# Patient Record
Sex: Female | Born: 1955
Health system: Southern US, Community
[De-identification: ages and names within clinical notes are randomized; demographics above are authoritative.]

## PROBLEM LIST (undated history)

## (undated) DIAGNOSIS — N3941 Urge incontinence: Secondary | ICD-10-CM

## (undated) DIAGNOSIS — R200 Anesthesia of skin: Secondary | ICD-10-CM

## (undated) DIAGNOSIS — K4090 Unilateral inguinal hernia, without obstruction or gangrene, not specified as recurrent: Secondary | ICD-10-CM

## (undated) DIAGNOSIS — R011 Cardiac murmur, unspecified: Secondary | ICD-10-CM

## (undated) DIAGNOSIS — Z87442 Personal history of urinary calculi: Secondary | ICD-10-CM

## (undated) DIAGNOSIS — F32A Depression, unspecified: Secondary | ICD-10-CM

## (undated) DIAGNOSIS — E785 Hyperlipidemia, unspecified: Secondary | ICD-10-CM

## (undated) DIAGNOSIS — Z8489 Family history of other specified conditions: Secondary | ICD-10-CM

## (undated) DIAGNOSIS — R202 Paresthesia of skin: Secondary | ICD-10-CM

## (undated) DIAGNOSIS — N83209 Unspecified ovarian cyst, unspecified side: Secondary | ICD-10-CM

## (undated) DIAGNOSIS — L719 Rosacea, unspecified: Secondary | ICD-10-CM

## (undated) DIAGNOSIS — K219 Gastro-esophageal reflux disease without esophagitis: Secondary | ICD-10-CM

## (undated) DIAGNOSIS — R350 Frequency of micturition: Secondary | ICD-10-CM

## (undated) DIAGNOSIS — R319 Hematuria, unspecified: Secondary | ICD-10-CM

## (undated) DIAGNOSIS — R519 Headache, unspecified: Secondary | ICD-10-CM

## (undated) DIAGNOSIS — N200 Calculus of kidney: Secondary | ICD-10-CM

## (undated) DIAGNOSIS — R3989 Other symptoms and signs involving the genitourinary system: Secondary | ICD-10-CM

## (undated) DIAGNOSIS — R51 Headache: Secondary | ICD-10-CM

## (undated) DIAGNOSIS — F329 Major depressive disorder, single episode, unspecified: Secondary | ICD-10-CM

## (undated) DIAGNOSIS — M1990 Unspecified osteoarthritis, unspecified site: Secondary | ICD-10-CM

## (undated) DIAGNOSIS — K5730 Diverticulosis of large intestine without perforation or abscess without bleeding: Secondary | ICD-10-CM

## (undated) DIAGNOSIS — M654 Radial styloid tenosynovitis [de Quervain]: Secondary | ICD-10-CM

## (undated) HISTORY — PX: CARDIAC CATHETERIZATION: SHX172

## (undated) HISTORY — PX: TYMPANOPLASTY: SHX33

## (undated) HISTORY — PX: TONSILLECTOMY: SUR1361

## (undated) HISTORY — DX: Gastro-esophageal reflux disease without esophagitis: K21.9

## (undated) HISTORY — PX: EXCISIONAL HEMORRHOIDECTOMY: SHX1541

## (undated) HISTORY — PX: COLONOSCOPY: SHX174

## (undated) HISTORY — PX: TUBAL LIGATION: SHX77

---

## 1998-10-23 ENCOUNTER — Other Ambulatory Visit: Admission: RE | Admit: 1998-10-23 | Discharge: 1998-10-23 | Payer: Self-pay | Admitting: *Deleted

## 2000-02-17 ENCOUNTER — Other Ambulatory Visit: Admission: RE | Admit: 2000-02-17 | Discharge: 2000-02-17 | Payer: Self-pay | Admitting: *Deleted

## 2001-03-21 ENCOUNTER — Emergency Department (HOSPITAL_COMMUNITY): Admission: EM | Admit: 2001-03-21 | Discharge: 2001-03-21 | Payer: Self-pay | Admitting: Emergency Medicine

## 2001-03-21 ENCOUNTER — Encounter: Payer: Self-pay | Admitting: Emergency Medicine

## 2001-03-23 ENCOUNTER — Ambulatory Visit (HOSPITAL_COMMUNITY): Admission: RE | Admit: 2001-03-23 | Discharge: 2001-03-23 | Payer: Self-pay | Admitting: Urology

## 2001-03-23 HISTORY — PX: OTHER SURGICAL HISTORY: SHX169

## 2001-04-21 ENCOUNTER — Other Ambulatory Visit: Admission: RE | Admit: 2001-04-21 | Discharge: 2001-04-21 | Payer: Self-pay | Admitting: *Deleted

## 2001-08-19 ENCOUNTER — Encounter (INDEPENDENT_AMBULATORY_CARE_PROVIDER_SITE_OTHER): Payer: Self-pay | Admitting: Specialist

## 2001-08-19 ENCOUNTER — Other Ambulatory Visit: Admission: RE | Admit: 2001-08-19 | Discharge: 2001-08-19 | Payer: Self-pay | Admitting: *Deleted

## 2002-05-10 ENCOUNTER — Other Ambulatory Visit: Admission: RE | Admit: 2002-05-10 | Discharge: 2002-05-10 | Payer: Self-pay | Admitting: *Deleted

## 2003-05-22 ENCOUNTER — Other Ambulatory Visit: Admission: RE | Admit: 2003-05-22 | Discharge: 2003-05-22 | Payer: Self-pay | Admitting: *Deleted

## 2004-07-17 ENCOUNTER — Other Ambulatory Visit: Admission: RE | Admit: 2004-07-17 | Discharge: 2004-07-17 | Payer: Self-pay | Admitting: *Deleted

## 2005-07-02 HISTORY — PX: NM MYOCAR PERF WALL MOTION: HXRAD629

## 2005-08-26 ENCOUNTER — Other Ambulatory Visit: Admission: RE | Admit: 2005-08-26 | Discharge: 2005-08-26 | Payer: Self-pay | Admitting: *Deleted

## 2006-10-20 ENCOUNTER — Other Ambulatory Visit: Admission: RE | Admit: 2006-10-20 | Discharge: 2006-10-20 | Payer: Self-pay | Admitting: *Deleted

## 2007-09-05 ENCOUNTER — Observation Stay (HOSPITAL_COMMUNITY): Admission: EM | Admit: 2007-09-05 | Discharge: 2007-09-05 | Payer: Self-pay | Admitting: Emergency Medicine

## 2007-11-03 ENCOUNTER — Other Ambulatory Visit: Admission: RE | Admit: 2007-11-03 | Discharge: 2007-11-03 | Payer: Self-pay | Admitting: *Deleted

## 2008-12-18 ENCOUNTER — Other Ambulatory Visit: Admission: RE | Admit: 2008-12-18 | Discharge: 2008-12-18 | Payer: Self-pay | Admitting: Gynecology

## 2009-04-28 ENCOUNTER — Emergency Department (HOSPITAL_BASED_OUTPATIENT_CLINIC_OR_DEPARTMENT_OTHER): Admission: EM | Admit: 2009-04-28 | Discharge: 2009-04-28 | Payer: Self-pay | Admitting: Emergency Medicine

## 2010-07-23 ENCOUNTER — Ambulatory Visit: Payer: Self-pay | Admitting: Family Medicine

## 2010-09-16 ENCOUNTER — Ambulatory Visit: Payer: Self-pay | Admitting: Family Medicine

## 2011-03-31 LAB — DIFFERENTIAL
Basophils Absolute: 0.2 10*3/uL — ABNORMAL HIGH (ref 0.0–0.1)
Basophils Relative: 1 % (ref 0–1)
Eosinophils Absolute: 0.1 10*3/uL (ref 0.0–0.7)
Eosinophils Relative: 1 % (ref 0–5)
Lymphocytes Relative: 13 % (ref 12–46)
Lymphs Abs: 1.8 10*3/uL (ref 0.7–4.0)
Monocytes Absolute: 0.9 10*3/uL (ref 0.1–1.0)
Monocytes Relative: 7 % (ref 3–12)
Neutro Abs: 10.7 10*3/uL — ABNORMAL HIGH (ref 1.7–7.7)
Neutrophils Relative %: 78 % — ABNORMAL HIGH (ref 43–77)

## 2011-03-31 LAB — CBC
HCT: 44.3 % (ref 36.0–46.0)
Hemoglobin: 15 g/dL (ref 12.0–15.0)
MCHC: 33.9 g/dL (ref 30.0–36.0)
MCV: 86.8 fL (ref 78.0–100.0)
Platelets: 298 10*3/uL (ref 150–400)
RBC: 5.1 MIL/uL (ref 3.87–5.11)
RDW: 12.3 % (ref 11.5–15.5)
WBC: 13.7 10*3/uL — ABNORMAL HIGH (ref 4.0–10.5)

## 2011-03-31 LAB — URINALYSIS, ROUTINE W REFLEX MICROSCOPIC
Nitrite: NEGATIVE
Protein, ur: NEGATIVE mg/dL
Urobilinogen, UA: 0.2 mg/dL (ref 0.0–1.0)

## 2011-03-31 LAB — BASIC METABOLIC PANEL
BUN: 19 mg/dL (ref 6–23)
CO2: 23 mEq/L (ref 19–32)
Calcium: 9.6 mg/dL (ref 8.4–10.5)
Chloride: 106 mEq/L (ref 96–112)
Creatinine, Ser: 0.7 mg/dL (ref 0.4–1.2)
GFR calc Af Amer: >60 mL/min (ref >60)
GFR calc non Af Amer: >60 mL/min (ref >60)
Glucose, Bld: 135 mg/dL — ABNORMAL HIGH (ref 70–99)
Potassium: 3.6 mEq/L (ref 3.5–5.1)
Sodium: 143 mEq/L (ref 135–145)

## 2011-03-31 LAB — POCT CARDIAC MARKERS
Myoglobin, poc: 18.7 ng/mL (ref 12–200)
Troponin i, poc: <0.05 ng/mL (ref 0.00–0.09)

## 2011-03-31 LAB — URINE CULTURE

## 2011-03-31 LAB — URINE MICROSCOPIC-ADD ON

## 2011-05-05 NOTE — Cardiovascular Report (Signed)
Kathy Kim, KLOSE NO.:  [id number]   MEDICAL RECORD NO.:  [id number]          PATIENT TYPE:  INP   LOCATION:  2852                         FACILITY:  [facility]   PHYSICIAN:  Nanetta Batty, M.D.   DATE OF BIRTH:  November 25, 1956   DATE OF PROCEDURE:  09/05/2007  DATE OF DISCHARGE:                            CARDIAC CATHETERIZATION   INDICATIONS:  [name] is a 55 year old, married, white female with  positive risk factors and chest pain who was seen in the office on  September 11, scheduled for a stress test later this month, but was in  the ER this afternoon with a prolonged episode of chest pain.  There  were no ST-segment changes on her EKG.  She was heparinized and presents  now for diagnostic coronary arteriography to define her anatomy and rule  out ischemic etiology.   DESCRIPTION OF PROCEDURE:  The patient was brought to the second floor  [facility] in the postabsorptive state.  She  was premedicated with IV Versed and fentanyl.  Her right groin was  prepped and shaved in the usual sterile fashion.  Xylocaine 1% was used  for local anesthesia.  A 6-French sheath was inserted into the right  femoral artery using standard Seldinger technique.  A 6-French, right  and left Judkins diagnostic catheter as well as pigtail catheter were  used for selective coronary cholangiography and left ventriculography  respectively.  Visipaque dye was used for the entirety of the case.  Aortic and ventricular blood pressures were recorded.   HEMODYNAMIC RESULTS:  Aortic systolic pressure 116, diastolic pressure  64.  Left ventricular systolic pressure 109, diastolic pressure 10.   Selective cholangiography:  1. Left main normal.  2. LAD normal.  3. Left circumflex normal.  4. Right coronary was dominant normal.   Left ventriculography:  RAO left ventriculogram was performed using 25  mL of Visipaque dye at 12 mL/second.  The overall LVEF was  estimated at  greater than 60% without focal wall motion abnormalities.   IMPRESSION:  [name] has essentially normal coronary arteries and normal  LV function.  I believe her chest is noncardiac.  Sheath was removed and  pressure applied to the groin achieving hemostasis.  The patient left  the lab in stable condition.  She will be discharged home later tonight.  She will see me back in the office in 1-2 weeks for followup.      Nanetta Batty, M.D.  Electronically Signed     JB/MEDQ  D:  09/05/2007  T:  09/06/2007  Job:  [id number]   cc:   [facility]  [facility]  Almedia Balls. Randell Patient, M.D.

## 2011-05-05 NOTE — Discharge Summary (Signed)
[name], Kim NO.:  [id number]   MEDICAL RECORD NO.:  [id number]          PATIENT TYPE:  INP   LOCATION:  3735                         FACILITY:  [facility]   PHYSICIAN:  Nanetta Batty, M.D.   DATE OF BIRTH:  May 05, 1956   DATE OF ADMISSION:  09/05/2007  DATE OF DISCHARGE:  09/05/2007                               DISCHARGE SUMMARY   Kathy Kim is a 55 year old female patient of Dr. Nanetta Batty,  who has no previous coronary history.  She does have a very significant  family history with a father having coronary disease beginning at the  age of 54 and died eventually of CHF.  She has a brother age 5 who has  had multiple stents put in and he is now 54 years old.  She apparently  has been having daily chest pain over the last month or so and  apparently she awakened last night secondary to her dog.  She then  started having chest pain.  It was in her left breast, radiated to her  axilla area.  She had some right hand numbness that lasted 15 minutes,  the longest ever.  She came to the emergency room.  She was seen by Dr.  Nanetta Batty.  It was recommended that she have a cardiac  catheterization.  This was performed.  She had normal coronary arteries  and normal LV function.  Thus, she was determined to have noncardiac  chest pain.  He recommended empiric treatment of antireflux medication  and that he will see her in 1 week in the office.  She was discharged  home on the day she came to the emergency room in stable condition.   DISCHARGE MEDICATIONS:  1. Vytorin 10/20 mg every night.  2. Fluoxetine 20 mg every night.  3. Protonix 40 mg a day.  If her insurance will not cover it, she can      try Prilosec 20 mg over-the-counter.   She has a follow-up appointment with Dr. Allyson Sabal on September 23, 2007, at  2:45.  She was recommended not to go back to work until approximately 4  days.  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  She was told not to do any  strenuous activity, lifting, pushing, pulling walking upstairs or  prolonged walking x1 week.  She knows to call if she has any problems  with her groin.   DISCHARGE DIAGNOSES:  1. Chest pain, not ischemic-related, possibly musculoskeletal or      gastroesophageal reflux disease.  2. Premature family history of coronary disease.  3. Hyperlipidemia.  4. History of nephrolithiasis.  5. Increased indigestion symptoms at home.  6. Increased stress.      [name], N.P.      Nanetta Batty, M.D.  Electronically Signed    BB/MEDQ  D:  09/05/2007  T:  09/06/2007  Job:  [id number]   cc:   Hiram Comber, MD

## 2011-05-08 NOTE — Op Note (Signed)
[facility]  Patient:    Kathy Kim, Kathy Kim                  MRN: [id number] Proc. Date: 03/23/01 Adm. Date:  [id number] Attending:  Lauree Chandler                           Operative Report  PREOPERATIVE DIAGNOSIS:  Distal right ureteral calculus.  POSTOPERATIVE DIAGNOSIS:  Distal right ureteral calculus.  PROCEDURE:  Cystoscopy and right ureteroscopic stone manipulation.  SURGEON:  Maretta Bees. Vonita Moss, M.D.  ANESTHESIA:  General.  INDICATIONS:  This 55 year old lady has had a stone when she was 55 years old but now has had a week history of intermittent right flank pain, pressure, urgency, and frequency of urination.  A CT scan showed a 3 mm stone at the right UVJ.  A KUB x-ray in the office showed a very faint calcification in the vicinity of the UVJ.  These are very bothersome symptoms.  She is brought to the OR today for stone removal.  DESCRIPTION OF PROCEDURE:  The patient was brought to the operating room and placed in the lithotomy position.  The external genitalia were prepped and draped in the usual fashion.  She was cystoscoped, and the bladder was unremarkable.  I then used a metal floppy-tipped guidewire and passed it up the intramural ureter and would go about 1 cm and then would not advance any further.  I then tried a guidewire M, and it would not advance more than 1 cm. So I used the short, ultrathin rigid ureteroscope and inserted into the right ureteral orifice and about 1 cm into the ureter.  I noticed this golden-yellow, very irregular stone.  With the ureteroscope in place in the intramural ureter, I then was able to negotiate the guidewire M above and beyond the stone documented fluoroscopically.  I then removed the ureteroscope and with the guidewire in place, reinserted the ureteroscope and used the nitinol stone basket to grab the stone, which was adherent to the mucosa of the intramural ureter, and as I brought the  stone out into the bladder, it crumbled into several small pieces.  I was able to retrieve a couple of small pieces, the others were too fine to irrigated out of the bladder.  I then reinserted the ureteroscope, and the intramural ureter showed no evidence of any significant injury or trauma from the manipulation, and I ureteroscoped the distal ureter and found no residual stone.  The ureteroscope was removed. I then cystoscoped her and retrieved a couple of small pieces of stone that will be adequate for analysis.  I removed the cystoscope and the guidewire, and she was taken to the recovery room in good condition having tolerated the procedure well. DD:  03/23/01 TD:  03/23/01 Job: [id number]

## 2011-10-01 LAB — POCT I-STAT CREATININE
Creatinine, Ser: 0.7
Operator id: 285841

## 2011-10-01 LAB — PROTIME-INR
INR: 0.9
Prothrombin Time: 12.8

## 2011-10-01 LAB — DIFFERENTIAL
Basophils Relative: 1
Lymphs Abs: 2.5
Monocytes Absolute: 0.5
Monocytes Relative: 9
Neutro Abs: 3

## 2011-10-01 LAB — I-STAT 8, (EC8 V) (CONVERTED LAB)
Acid-base deficit: 1
Bicarbonate: 24.5 — ABNORMAL HIGH
Glucose, Bld: 113 — ABNORMAL HIGH
Potassium: 4.4
TCO2: 26
pCO2, Ven: 42 — ABNORMAL LOW
pH, Ven: 7.373 — ABNORMAL HIGH

## 2011-10-01 LAB — COMPREHENSIVE METABOLIC PANEL
Albumin: 3.7
Alkaline Phosphatase: 72
BUN: 14
Calcium: 9
Creatinine, Ser: 0.59
Glucose, Bld: 96
Total Protein: 6.5

## 2011-10-01 LAB — POCT CARDIAC MARKERS
CKMB, poc: <1 — ABNORMAL LOW
Myoglobin, poc: 32.8
Myoglobin, poc: 50.1
Troponin i, poc: <0.05
Troponin i, poc: <0.05

## 2011-10-01 LAB — CBC
Hemoglobin: 13
MCHC: 34
MCV: 86.4
RBC: 4.43
WBC: 6.2

## 2011-10-01 LAB — MAGNESIUM: Magnesium: 1.9

## 2011-10-01 LAB — TSH: TSH: 1.211

## 2011-10-01 LAB — CK TOTAL AND CKMB (NOT AT ARMC)
CK, MB: 1.7
Relative Index: INVALID
Total CK: 82

## 2011-10-01 LAB — TROPONIN I: Troponin I: <0.01

## 2011-11-19 ENCOUNTER — Encounter: Payer: Self-pay | Admitting: Family Medicine

## 2012-12-21 HISTORY — PX: ABDOMINOPLASTY: SUR9

## 2013-04-21 ENCOUNTER — Other Ambulatory Visit: Payer: Self-pay | Admitting: Gynecology

## 2013-04-21 DIAGNOSIS — R928 Other abnormal and inconclusive findings on diagnostic imaging of breast: Secondary | ICD-10-CM

## 2013-05-02 ENCOUNTER — Ambulatory Visit
Admission: RE | Admit: 2013-05-02 | Discharge: 2013-05-02 | Disposition: A | Discharge status: Home / Self Care | Payer: 59 | Source: Ambulatory Visit | Attending: Gynecology | Admitting: Gynecology

## 2013-05-02 DIAGNOSIS — R928 Other abnormal and inconclusive findings on diagnostic imaging of breast: Secondary | ICD-10-CM

## 2013-05-11 ENCOUNTER — Encounter (HOSPITAL_BASED_OUTPATIENT_CLINIC_OR_DEPARTMENT_OTHER)
Admission: RE | Disposition: A | Discharge status: Home / Self Care | Payer: Self-pay | Source: Ambulatory Visit | Attending: Urology

## 2013-05-11 ENCOUNTER — Other Ambulatory Visit: Payer: Self-pay | Admitting: Urology

## 2013-05-11 ENCOUNTER — Encounter (HOSPITAL_BASED_OUTPATIENT_CLINIC_OR_DEPARTMENT_OTHER): Payer: Self-pay | Admitting: Anesthesiology

## 2013-05-11 ENCOUNTER — Ambulatory Visit (HOSPITAL_BASED_OUTPATIENT_CLINIC_OR_DEPARTMENT_OTHER): Payer: 59 | Admitting: Anesthesiology

## 2013-05-11 ENCOUNTER — Ambulatory Visit (HOSPITAL_BASED_OUTPATIENT_CLINIC_OR_DEPARTMENT_OTHER)
Admission: RE | Admit: 2013-05-11 | Discharge: 2013-05-11 | Disposition: A | Discharge status: Home / Self Care | Payer: 59 | Source: Ambulatory Visit | Attending: Urology | Admitting: Urology

## 2013-05-11 DIAGNOSIS — Z885 Allergy status to narcotic agent status: Secondary | ICD-10-CM | POA: Insufficient documentation

## 2013-05-11 DIAGNOSIS — K219 Gastro-esophageal reflux disease without esophagitis: Secondary | ICD-10-CM | POA: Insufficient documentation

## 2013-05-11 DIAGNOSIS — N1330 Unspecified hydronephrosis: Secondary | ICD-10-CM | POA: Insufficient documentation

## 2013-05-11 DIAGNOSIS — Z883 Allergy status to other anti-infective agents status: Secondary | ICD-10-CM | POA: Insufficient documentation

## 2013-05-11 DIAGNOSIS — Z780 Asymptomatic menopausal state: Secondary | ICD-10-CM | POA: Insufficient documentation

## 2013-05-11 DIAGNOSIS — N3289 Other specified disorders of bladder: Secondary | ICD-10-CM | POA: Insufficient documentation

## 2013-05-11 DIAGNOSIS — R3129 Other microscopic hematuria: Secondary | ICD-10-CM

## 2013-05-11 DIAGNOSIS — Z79899 Other long term (current) drug therapy: Secondary | ICD-10-CM | POA: Insufficient documentation

## 2013-05-11 DIAGNOSIS — N201 Calculus of ureter: Secondary | ICD-10-CM | POA: Insufficient documentation

## 2013-05-11 DIAGNOSIS — Z7982 Long term (current) use of aspirin: Secondary | ICD-10-CM | POA: Insufficient documentation

## 2013-05-11 HISTORY — PX: CYSTOSCOPY W/ URETERAL STENT PLACEMENT: SHX1429

## 2013-05-11 LAB — POCT I-STAT, CHEM 8
BUN: 18 mg/dL (ref 6–23)
Calcium, Ion: 1.22 mmol/L (ref 1.12–1.23)
Chloride: 107 mEq/L (ref 96–112)
Creatinine, Ser: 0.6 mg/dL (ref 0.50–1.10)
Glucose, Bld: 96 mg/dL (ref 70–99)
HCT: 44 % (ref 36.0–46.0)
Hemoglobin: 15 g/dL (ref 12.0–15.0)
Potassium: 3.9 mEq/L (ref 3.5–5.1)
Sodium: 140 mEq/L (ref 135–145)
TCO2: 24 mmol/L (ref 0–100)

## 2013-05-11 SURGERY — CYSTOSCOPY, WITH RETROGRADE PYELOGRAM AND URETERAL STENT INSERTION
Anesthesia: General | Site: Ureter | Laterality: Right | Wound class: Clean Contaminated

## 2013-05-11 MED ORDER — DEXAMETHASONE SODIUM PHOSPHATE 4 MG/ML IJ SOLN
INTRAMUSCULAR | Status: DC | PRN
  Administered 2013-05-11: 4 mg via INTRAVENOUS

## 2013-05-11 MED ORDER — LACTATED RINGERS IV SOLN
INTRAVENOUS | Status: DC | PRN
  Administered 2013-05-11: 13:00:00 via INTRAVENOUS

## 2013-05-11 MED ORDER — PHENAZOPYRIDINE HCL 100 MG PO TABS: 100.0000 mg | ORAL_TABLET | Freq: Three times a day (TID) | ORAL | Status: DC | PRN

## 2013-05-11 MED ORDER — PROPOFOL 10 MG/ML IV BOLUS
INTRAVENOUS | Status: DC | PRN
  Administered 2013-05-11: 250 mg via INTRAVENOUS

## 2013-05-11 MED ORDER — OXYBUTYNIN CHLORIDE 5 MG PO TABS: 5.0000 mg | ORAL_TABLET | Freq: Four times a day (QID) | ORAL | Status: DC | PRN

## 2013-05-11 MED ORDER — LIDOCAINE HCL (CARDIAC) 20 MG/ML IV SOLN
INTRAVENOUS | Status: DC | PRN
  Administered 2013-05-11: 100 mg via INTRAVENOUS

## 2013-05-11 MED ORDER — KETOROLAC TROMETHAMINE 30 MG/ML IJ SOLN
15.0000 mg | Freq: Once | INTRAMUSCULAR | Status: DC | PRN
  Filled 2013-05-11: qty 1

## 2013-05-11 MED ORDER — HYDROCODONE-ACETAMINOPHEN 5-325 MG PO TABS: 1.0000 | ORAL_TABLET | ORAL | Status: DC | PRN

## 2013-05-11 MED ORDER — MIDAZOLAM HCL 5 MG/5ML IJ SOLN
INTRAMUSCULAR | Status: DC | PRN
  Administered 2013-05-11 (×2): 1 mg via INTRAVENOUS

## 2013-05-11 MED ORDER — FENTANYL CITRATE 0.05 MG/ML IJ SOLN
INTRAMUSCULAR | Status: DC | PRN
  Administered 2013-05-11: 25 ug via INTRAVENOUS
  Administered 2013-05-11 (×2): 50 ug via INTRAVENOUS
  Administered 2013-05-11 (×3): 25 ug via INTRAVENOUS

## 2013-05-11 MED ORDER — LACTATED RINGERS IV SOLN
INTRAVENOUS | Status: DC
  Administered 2013-05-11: 13:00:00 via INTRAVENOUS
  Filled 2013-05-11: qty 1000

## 2013-05-11 MED ORDER — IOHEXOL 350 MG/ML SOLN
INTRAVENOUS | Status: DC | PRN
  Administered 2013-05-11: 7 mL via INTRAVENOUS

## 2013-05-11 MED ORDER — PROMETHAZINE HCL 25 MG/ML IJ SOLN
6.2500 mg | INTRAMUSCULAR | Status: DC | PRN
  Filled 2013-05-11: qty 1

## 2013-05-11 MED ORDER — CEFAZOLIN SODIUM-DEXTROSE 2-3 GM-% IV SOLR
INTRAVENOUS | Status: DC | PRN
  Administered 2013-05-11: 2 g via INTRAVENOUS

## 2013-05-11 MED ORDER — ONDANSETRON HCL 4 MG/2ML IJ SOLN
INTRAMUSCULAR | Status: DC | PRN
  Administered 2013-05-11: 4 mg via INTRAVENOUS

## 2013-05-11 MED ORDER — FENTANYL CITRATE 0.05 MG/ML IJ SOLN
25.0000 ug | INTRAMUSCULAR | Status: DC | PRN
  Filled 2013-05-11: qty 1

## 2013-05-11 MED ORDER — CEFAZOLIN SODIUM-DEXTROSE 2-3 GM-% IV SOLR
2.0000 g | INTRAVENOUS | Status: DC
  Filled 2013-05-11: qty 50

## 2013-05-11 MED ORDER — KETOROLAC TROMETHAMINE 30 MG/ML IJ SOLN
INTRAMUSCULAR | Status: DC | PRN
  Administered 2013-05-11: 30 mg via INTRAVENOUS

## 2013-05-11 MED ORDER — STERILE WATER FOR IRRIGATION IR SOLN
Status: DC | PRN
  Administered 2013-05-11: 3000 mL

## 2013-05-11 MED ORDER — SENNOSIDES-DOCUSATE SODIUM 8.6-50 MG PO TABS: 1.0000 | ORAL_TABLET | Freq: Two times a day (BID) | ORAL | Status: DC

## 2013-05-11 MED ORDER — BELLADONNA ALKALOIDS-OPIUM 16.2-60 MG RE SUPP
RECTAL | Status: DC | PRN
  Administered 2013-05-11: 1 via RECTAL

## 2013-05-11 MED ORDER — HYOSCYAMINE SULFATE 0.125 MG PO TABS: 0.1250 mg | ORAL_TABLET | ORAL | Status: DC | PRN

## 2013-05-11 SURGICAL SUPPLY — 20 items
ADAPTER CATH URET PLST 4-6FR (CATHETERS) ×2 IMPLANT
ADPR CATH URET STRL DISP 4-6FR (CATHETERS) ×1
BAG DRAIN URO-CYSTO SKYTR STRL (DRAIN) ×2 IMPLANT
BAG DRN UROCATH (DRAIN) ×1
CANISTER SUCT LVC 12 LTR MEDI- (MISCELLANEOUS) ×2 IMPLANT
CATH INTERMIT  6FR 70CM (CATHETERS) ×2 IMPLANT
CLOTH BEACON ORANGE TIMEOUT ST (SAFETY) ×2 IMPLANT
DRAPE CAMERA CLOSED 9X96 (DRAPES) ×2 IMPLANT
GLOVE BIO SURGEON STRL SZ7 (GLOVE) ×2 IMPLANT
GLOVE INDICATOR 7.5 STRL GRN (GLOVE) IMPLANT
GOWN PREVENTION PLUS LG XLONG (DISPOSABLE) ×2 IMPLANT
GOWN STRL REIN XL XLG (GOWN DISPOSABLE) ×2 IMPLANT
GUIDEWIRE 0.038 PTFE COATED (WIRE) IMPLANT
GUIDEWIRE ANG ZIPWIRE 038X150 (WIRE) IMPLANT
GUIDEWIRE STR DUAL SENSOR (WIRE) ×2 IMPLANT
IV NS IRRIG 3000ML ARTHROMATIC (IV SOLUTION) IMPLANT
NS IRRIG 500ML POUR BTL (IV SOLUTION) IMPLANT
PACK CYSTOSCOPY (CUSTOM PROCEDURE TRAY) ×2 IMPLANT
STENT URET 6FRX24 CONTOUR (STENTS) ×2 IMPLANT
WATER STERILE IRR 3000ML UROMA (IV SOLUTION) ×2 IMPLANT

## 2013-05-11 NOTE — Transfer of Care (Signed)
Immediate Anesthesia Transfer of Care Note  Patient: Kathy Kim  Procedure(s) Performed: Procedure(s) (LRB): CYSTOSCOPY WITH RETROGRADE PYELOGRAM/URETERAL STENT PLACEMENT, BLADDER BIOPSY (Right)  Patient Location: PACU  Anesthesia Type: General  Level of Consciousness: awake, sedated, patient cooperative and responds to stimulation  Airway & Oxygen Therapy: Patient Spontanous Breathing and Patient connected to face mask oxygen  Post-op Assessment: Report given to PACU RN, Post -op Vital signs reviewed and stable and Patient moving all extremities  Post vital signs: Reviewed and stable  Complications: No apparent anesthesia complications

## 2013-05-11 NOTE — Anesthesia Procedure Notes (Signed)
Procedure Name: LMA Insertion Date/Time: 05/11/2013 1:15 PM Performed by: Jessica Priest Pre-anesthesia Checklist: Patient identified, Emergency Drugs available, Suction available and Patient being monitored Patient Re-evaluated:Patient Re-evaluated prior to inductionOxygen Delivery Method: Circle System Utilized Preoxygenation: Pre-oxygenation with 100% oxygen Intubation Type: IV induction Ventilation: Mask ventilation without difficulty LMA: LMA inserted LMA Size: 4.0 Number of attempts: 1 Airway Equipment and Method: bite block Placement Confirmation: positive ETCO2 Tube secured with: Tape Dental Injury: Teeth and Oropharynx as per pre-operative assessment

## 2013-05-11 NOTE — Op Note (Signed)
Urology Operative Report  Date of Procedure: 05/11/13  Surgeon: Natalia Leatherwood, MD Assistant:  None  Preoperative Diagnosis: Right ureter stone. Microscopic hematuria. Postoperative Diagnosis:  Same  Procedure(s): Cystoscopy Bladder biopsy Bilateral retrograde pyelogram Right ureter stent placement  Estimated blood loss: None  Specimen: Bladder biopsy  Drains: None  Complications: None  Findings: Erythema involving the posterior bladder wall.  Negative filling defects in the left ureter or collecting system. Large stone in the right proximal ureter with proximal hydronephrosis.   History of present illness: Patient was in the clinic today obtaining a CT hematuria protocol for hematuria workup. During the CT scan the technologist noted a large obstructing right ureter stone. She was placed in a my clinic and we discussed management of the stone. It appeared that she had severe hydronephrosis and thinning of her renal parenchyma. This is likely a chronic issue I recommended placing a stent to prevent further damage from her stone. She also has history of microscopic hematuria. She was scheduled to have a cystoscopy in clinic, but since when he to place a stent I recommended just doing this in the operating room.   Procedure in detail: After informed consent was obtained, the patient was taken to the operating room. They were placed in the supine position. SCDs were turned on and in place. IV antibiotics were infused, and general anesthesia was induced. A timeout was performed in which the correct patient, surgical site, and procedure were identified and agreed upon by the team.  The patient was placed in a dorsolithotomy position, making sure to pad all pertinent neurovascular pressure points. A belladonna and opium suppository was placed into her rectum The genitals were prepped and draped in the usual sterile fashion.  The bladder was drained and then evaluated in a systematic  fashion to where the entire surface of her bladder was visualized. There was noted to be diffuse erythema over the posterior bladder wall. I elected to biopsy this given her history of microscopic hematuria. Cold cup biopsy forceps were used to take a sample of this area and then the biopsy site was cauterized with a Bugbee electrode.  Attention was turned to the left ureter orifice. It was cannulated with a 5 [place] ureter catheter and I injected contrast to obtain a retrograde PolyGram. There were no filling defects or hydronephrosis noted.  Attention was turned to the right kidney. The contrast from the CT scan was noted to be filling the right kidney with severe hydronephrosis. There was a proximal ureter stone which was visualized on fluoroscopy. I inserted a 5 [place] ureter catheter into the right ureter orifice and injected contrast to obtain a retrograde PolyGram. No filling defect was that which was consistent with the proximal ureter stone.  I then placed a sensor tip wire up the right ureter orifice under fluoroscopy. This was difficult to place around the stone, but was eventually able to be passed. I then placed a 6 x 24 double-J ureter stent without the strings over the sensor wire. This was able to be deployed with a good curl in the right renal pelvis on fluoroscopy and a curl in the bladder.   This completed the procedure. I inserted 20 mL of lidocaine jelly into the urethra. She's placed back in a supine position, anesthesia was reversed, and she was taken to the [facility] in stable condition.  All counts were correct at the end of the case.

## 2013-05-11 NOTE — Anesthesia Preprocedure Evaluation (Signed)

## 2013-05-11 NOTE — H&P (Signed)
Urology History and Physical Exam  CC: Right ureter stone. Microhematuria  HPI:  57 year old female returns today due to her right ureter stone. She was scheduled for CT only today for workup of microscopic hematuria. On CT scan there was noted to be a 14.7 mm right proximal ureter stone. This was associated with proximal right hydronephrosis. She had thinning of the print them of her upper pole kidney which is concerning for pass damage to her kidney. Does not associated with gross hematuria. She's not had fever or chills. She states she started having right hip pain that radiates down her leg 3 days ago. Nothing makes it better or worse. She not had anything to eat since 7 PM last night. She had water at 5 AM this morning. Today we discussed options for her obstructing stone. I'm concerned that she has had damage to her kidney in the past and I feel that relieving obstruction would be in her best interest. We discussed ureter stent placement or percutaneous nephrostomy tube placement. We discussed the risks, benefits of each of these. I recommend right ureter stent placement. She also has a history of microscopic hematuria. We can perform a cystoscopy and bilateral retrogrades at the same time to workup her microscopic hematuria. We discussed the risk and benefits of this.  PMH: Past Medical History  Diagnosis Date  . GERD (gastroesophageal reflux disease)     PSH: Past Surgical History  Procedure Laterality Date  . Cardiac catheterization  2008    NEGATIVE    Allergies: Allergies  Allergen Reactions  . Tetracyclines & Related Hives  . Codeine Rash    Medications: Prescriptions prior to admission  Medication Sig Dispense Refill  . acidophilus (RISAQUAD) CAPS Take 2 capsules by mouth daily.      Marland Kitchen aspirin 81 MG tablet Take 81 mg by mouth daily.      Marland Kitchen co-enzyme Q-10 30 MG capsule Take 30 mg by mouth daily.      . fish oil-omega-3 fatty acids 1000 MG capsule Take 5 mg by mouth  daily.      . Multiple Vitamin (MULTIVITAMIN) tablet Take 1 tablet by mouth daily.      . simvastatin (ZOCOR) 40 MG tablet Take 40 mg by mouth at bedtime.        Marland Kitchen allopurinol (ZYLOPRIM) 100 MG tablet Take 100 mg by mouth daily.        Marland Kitchen trimethoprim (TRIMPEX) 100 MG tablet Take 100 mg by mouth daily.           Social History: History   Social History  . Marital Status: Married    Spouse Name: N/A    Number of Children: N/A  . Years of Education: N/A   Occupational History  . Not on file.   Social History Main Topics  . Smoking status: Not on file  . Smokeless tobacco: Not on file  . Alcohol Use: Not on file  . Drug Use: Not on file  . Sexually Active: Yes    Birth Control/ Protection: Post-menopausal   Other Topics Concern  . Not on file   Social History Narrative  . No narrative on file    Family History: Family History  Problem Relation Age of Onset  . Arthritis Mother   . Mental illness Mother   . Hypertension Mother   . Stroke Mother   . Diabetes Father   . Heart disease Father   . Hypertension Father   . Hypertension Brother   .  Cancer Paternal Grandmother     Review of Systems: Positive: Right hip pain. Sinus infection (resolving). Negative: Chest pain, SOB, fever.  A further 10 point review of systems was negative except what is listed in the HPI.  Physical Exam: Filed Vitals:   05/11/13 1223  BP: 124/87  Pulse: 79  Temp: 97.9 F (36.6 C)  Resp: 16    General: No acute distress.  Awake. Head:  Normocephalic.  Atraumatic. ENT:  EOMI.  Mucous membranes moist Neck:  Supple.  No lymphadenopathy. CV:  S1 present. S2 present. Regular rate. Pulmonary: Equal effort bilaterally.  Clear to auscultation bilaterally. Abdomen: Soft.  Non- tender to palpation. Skin:  Normal turgor.  No visible rash. Extremity: No gross deformity of bilateral upper extremities.  No gross deformity of    bilateral lower extremities. Neurologic: Alert. Appropriate  mood.   Studies:  No results found for this basename: HGB, WBC, PLT,  in the last 72 hours  No results found for this basename: NA, K, CL, CO2, BUN, CREATININE, CALCIUM, MAGNESIUM, GFRNONAA, GFRAA,  in the last 72 hours   No results found for this basename: PT, INR, APTT,  in the last 72 hours   No components found with this basename: ABG,     Assessment:  Right ureter stone. Microscopic hematuria.  Plan: To OR for cystoscopy, bilateral retrograde pyelogram, right ureter stent placement.

## 2013-05-12 ENCOUNTER — Other Ambulatory Visit: Payer: Self-pay | Admitting: Urology

## 2013-05-16 ENCOUNTER — Encounter (HOSPITAL_BASED_OUTPATIENT_CLINIC_OR_DEPARTMENT_OTHER): Payer: Self-pay | Admitting: Urology

## 2013-05-16 NOTE — Progress Notes (Signed)
NPO AFTER MN. ARRIVES AT 0930. ISTAT 8 DONE 05-11-2013. MAY TAKE RX PAIN MED IF NEEDED W/ SIP OF WATER (ONE TYPE)

## 2013-05-18 ENCOUNTER — Encounter (HOSPITAL_BASED_OUTPATIENT_CLINIC_OR_DEPARTMENT_OTHER): Payer: Self-pay | Admitting: Anesthesiology

## 2013-05-18 ENCOUNTER — Encounter (HOSPITAL_BASED_OUTPATIENT_CLINIC_OR_DEPARTMENT_OTHER)
Admission: RE | Disposition: A | Discharge status: Home / Self Care | Payer: Self-pay | Source: Ambulatory Visit | Attending: Urology

## 2013-05-18 ENCOUNTER — Ambulatory Visit (HOSPITAL_BASED_OUTPATIENT_CLINIC_OR_DEPARTMENT_OTHER)
Admission: RE | Admit: 2013-05-18 | Discharge: 2013-05-18 | Disposition: A | Discharge status: Home / Self Care | Payer: 59 | Source: Ambulatory Visit | Attending: Urology | Admitting: Urology

## 2013-05-18 ENCOUNTER — Ambulatory Visit (HOSPITAL_BASED_OUTPATIENT_CLINIC_OR_DEPARTMENT_OTHER): Payer: 59 | Admitting: Anesthesiology

## 2013-05-18 DIAGNOSIS — N1330 Unspecified hydronephrosis: Secondary | ICD-10-CM | POA: Insufficient documentation

## 2013-05-18 DIAGNOSIS — Z883 Allergy status to other anti-infective agents status: Secondary | ICD-10-CM | POA: Insufficient documentation

## 2013-05-18 DIAGNOSIS — Z87891 Personal history of nicotine dependence: Secondary | ICD-10-CM | POA: Insufficient documentation

## 2013-05-18 DIAGNOSIS — Z885 Allergy status to narcotic agent status: Secondary | ICD-10-CM | POA: Insufficient documentation

## 2013-05-18 DIAGNOSIS — R1310 Dysphagia, unspecified: Secondary | ICD-10-CM | POA: Insufficient documentation

## 2013-05-18 DIAGNOSIS — K5900 Constipation, unspecified: Secondary | ICD-10-CM | POA: Insufficient documentation

## 2013-05-18 DIAGNOSIS — N201 Calculus of ureter: Secondary | ICD-10-CM | POA: Insufficient documentation

## 2013-05-18 DIAGNOSIS — K219 Gastro-esophageal reflux disease without esophagitis: Secondary | ICD-10-CM | POA: Insufficient documentation

## 2013-05-18 HISTORY — DX: Personal history of urinary calculi: Z87.442

## 2013-05-18 HISTORY — PX: HOLMIUM LASER APPLICATION: SHX5852

## 2013-05-18 HISTORY — DX: Frequency of micturition: R35.0

## 2013-05-18 HISTORY — DX: Hematuria, unspecified: R31.9

## 2013-05-18 HISTORY — PX: CYSTOSCOPY WITH RETROGRADE PYELOGRAM, URETEROSCOPY AND STENT PLACEMENT: SHX5789

## 2013-05-18 LAB — POCT I-STAT, CHEM 8
BUN: 22 mg/dL (ref 6–23)
Creatinine, Ser: 0.9 mg/dL (ref 0.50–1.10)
Glucose, Bld: 102 mg/dL — ABNORMAL HIGH (ref 70–99)
Hemoglobin: 13.6 g/dL (ref 12.0–15.0)
Potassium: 4.3 mEq/L (ref 3.5–5.1)

## 2013-05-18 SURGERY — CYSTOURETEROSCOPY, WITH RETROGRADE PYELOGRAM AND STENT INSERTION
Anesthesia: General | Site: Ureter | Laterality: Right | Wound class: Clean Contaminated

## 2013-05-18 MED ORDER — SODIUM CHLORIDE 0.9 % IR SOLN
Status: DC | PRN
  Administered 2013-05-18: 6000 mL

## 2013-05-18 MED ORDER — LACTATED RINGERS IV SOLN
INTRAVENOUS | Status: DC
  Administered 2013-05-18: 12:00:00 via INTRAVENOUS
  Administered 2013-05-18: 100 mL/h via INTRAVENOUS
  Filled 2013-05-18: qty 1000

## 2013-05-18 MED ORDER — BELLADONNA ALKALOIDS-OPIUM 16.2-60 MG RE SUPP
RECTAL | Status: DC | PRN
  Administered 2013-05-18: 1 via RECTAL

## 2013-05-18 MED ORDER — FENTANYL CITRATE 0.05 MG/ML IJ SOLN
25.0000 ug | INTRAMUSCULAR | Status: DC | PRN
  Filled 2013-05-18: qty 1

## 2013-05-18 MED ORDER — CEFAZOLIN SODIUM-DEXTROSE 2-3 GM-% IV SOLR
2.0000 g | INTRAVENOUS | Status: AC
  Administered 2013-05-18: 2 g via INTRAVENOUS
  Filled 2013-05-18: qty 50

## 2013-05-18 MED ORDER — CEPHALEXIN 500 MG PO CAPS: 500.0000 mg | ORAL_CAPSULE | Freq: Three times a day (TID) | ORAL | Status: DC

## 2013-05-18 MED ORDER — DEXAMETHASONE SODIUM PHOSPHATE 4 MG/ML IJ SOLN
INTRAMUSCULAR | Status: DC | PRN
  Administered 2013-05-18: 10 mg via INTRAVENOUS

## 2013-05-18 MED ORDER — ACETAMINOPHEN 10 MG/ML IV SOLN
INTRAVENOUS | Status: DC | PRN
  Administered 2013-05-18: 1000 mg via INTRAVENOUS

## 2013-05-18 MED ORDER — MIDAZOLAM HCL 5 MG/5ML IJ SOLN
INTRAMUSCULAR | Status: DC | PRN
  Administered 2013-05-18: 2 mg via INTRAVENOUS

## 2013-05-18 MED ORDER — LIDOCAINE HCL (CARDIAC) 20 MG/ML IV SOLN
INTRAVENOUS | Status: DC | PRN
  Administered 2013-05-18: 70 mg via INTRAVENOUS

## 2013-05-18 MED ORDER — IOHEXOL 350 MG/ML SOLN
INTRAVENOUS | Status: DC | PRN
  Administered 2013-05-18: 10 mL

## 2013-05-18 MED ORDER — PROMETHAZINE HCL 25 MG/ML IJ SOLN
6.2500 mg | INTRAMUSCULAR | Status: DC | PRN
  Filled 2013-05-18: qty 1

## 2013-05-18 MED ORDER — ONDANSETRON HCL 4 MG/2ML IJ SOLN
INTRAMUSCULAR | Status: DC | PRN
  Administered 2013-05-18: 4 mg via INTRAVENOUS

## 2013-05-18 MED ORDER — HYDROMORPHONE HCL 4 MG PO TABS: 4.0000 mg | ORAL_TABLET | ORAL | Status: DC | PRN

## 2013-05-18 MED ORDER — KETOROLAC TROMETHAMINE 30 MG/ML IJ SOLN
INTRAMUSCULAR | Status: DC | PRN
  Administered 2013-05-18: 30 mg via INTRAVENOUS

## 2013-05-18 MED ORDER — PROPOFOL 10 MG/ML IV BOLUS
INTRAVENOUS | Status: DC | PRN
  Administered 2013-05-18: 250 mg via INTRAVENOUS

## 2013-05-18 MED ORDER — FENTANYL CITRATE 0.05 MG/ML IJ SOLN
INTRAMUSCULAR | Status: DC | PRN
  Administered 2013-05-18: 75 ug via INTRAVENOUS
  Administered 2013-05-18 (×2): 50 ug via INTRAVENOUS
  Administered 2013-05-18: 25 ug via INTRAVENOUS

## 2013-05-18 MED ORDER — MEPERIDINE HCL 25 MG/ML IJ SOLN
6.2500 mg | INTRAMUSCULAR | Status: DC | PRN
  Filled 2013-05-18: qty 1

## 2013-05-18 MED ORDER — LACTATED RINGERS IV SOLN
INTRAVENOUS | Status: DC
  Filled 2013-05-18: qty 1000

## 2013-05-18 SURGICAL SUPPLY — 42 items
ADAPTER CATH URET PLST 4-6FR (CATHETERS) IMPLANT
ADPR CATH URET STRL DISP 4-6FR (CATHETERS)
BAG DRAIN URO-CYSTO SKYTR STRL (DRAIN) ×2 IMPLANT
BAG DRN UROCATH (DRAIN) ×1
BASKET LASER NITINOL 1.9FR (BASKET) IMPLANT
BASKET STNLS GEMINI 4WIRE 3FR (BASKET) IMPLANT
BASKET ZERO TIP NITINOL 2.4FR (BASKET) ×2 IMPLANT
BRUSH URET BIOPSY 3F (UROLOGICAL SUPPLIES) IMPLANT
BSKT STON RTRVL 120 1.9FR (BASKET)
BSKT STON RTRVL GEM 120X11 3FR (BASKET)
BSKT STON RTRVL ZERO TP 2.4FR (BASKET) ×1
CANISTER SUCT LVC 12 LTR MEDI- (MISCELLANEOUS) ×2 IMPLANT
CATH CLEAR GEL 3F BACKSTOP (CATHETERS) IMPLANT
CATH INTERMIT  6FR 70CM (CATHETERS) IMPLANT
CATH URET 5FR 28IN CONE TIP (BALLOONS)
CATH URET 5FR 28IN OPEN ENDED (CATHETERS) ×2 IMPLANT
CATH URET 5FR 70CM CONE TIP (BALLOONS) IMPLANT
CATH URET DUAL LUMEN 6-10FR 50 (CATHETERS) IMPLANT
CLOTH BEACON ORANGE TIMEOUT ST (SAFETY) ×2 IMPLANT
DRAPE CAMERA CLOSED 9X96 (DRAPES) ×2 IMPLANT
ELECT REM PT RETURN 9FT ADLT (ELECTROSURGICAL)
ELECTRODE REM PT RTRN 9FT ADLT (ELECTROSURGICAL) IMPLANT
GLOVE BIO SURGEON STRL SZ7 (GLOVE) ×2 IMPLANT
GLOVE ECLIPSE 7.0 STRL STRAW (GLOVE) ×2 IMPLANT
GLOVE INDICATOR 7.5 STRL GRN (GLOVE) ×2 IMPLANT
GOWN PREVENTION PLUS LG XLONG (DISPOSABLE) ×4 IMPLANT
GUIDEWIRE 0.038 PTFE COATED (WIRE) IMPLANT
GUIDEWIRE ANG ZIPWIRE 038X150 (WIRE) IMPLANT
GUIDEWIRE STR DUAL SENSOR (WIRE) ×4 IMPLANT
IV NS IRRIG 3000ML ARTHROMATIC (IV SOLUTION) ×2 IMPLANT
KIT BALLIN UROMAX 15FX10 (LABEL) IMPLANT
KIT BALLN UROMAX 15FX4 (MISCELLANEOUS) IMPLANT
KIT BALLN UROMAX 26 75X4 (MISCELLANEOUS)
LASER FIBER DISP (UROLOGICAL SUPPLIES) ×2 IMPLANT
PACK CYSTOSCOPY (CUSTOM PROCEDURE TRAY) ×2 IMPLANT
SET HIGH PRES BAL DIL (LABEL)
SHEATH ACCESS URETERAL 38CM (SHEATH) ×2 IMPLANT
SHEATH ACCESS URETERAL 54CM (SHEATH) IMPLANT
SHEATH URET ACCESS 12FR/35CM (UROLOGICAL SUPPLIES) IMPLANT
SHEATH URET ACCESS 12FR/55CM (UROLOGICAL SUPPLIES) IMPLANT
STENT PERCUFLEX 4.8FRX24 (STENTS) ×2 IMPLANT
SYRINGE IRR TOOMEY STRL 70CC (SYRINGE) IMPLANT

## 2013-05-18 NOTE — H&P (Signed)
Urology History and Physical Exam  CC: Right proximal ureter stone.  HPI:  57 year old female presents today for a right ureter stone. This was discovered incidentally on workup for hematuria. It is located in the right proximal ureter. It is approximately 15 mm in size. It was associated with severe right hydronephrosis. On the day she had CT scan on 05/11/13, she was brought in to my office and she elected to proceed with right ureter stent placement. That was completed that night. Urine culture 05/16/13 was negative for growth. We discussed management options and she has elected to have ureteroscopy. She has tolerated her stent poorly. She presents today for cystoscopy, right ureteroscopy, laser lithotripsy, right retrograde pyelogram, and right ureter stent exchange. We have discussed the risks, benefits, alternatives, and likelihood of achieving goals.  PMH: Past Medical History  Diagnosis Date  . GERD (gastroesophageal reflux disease)   . History of kidney stones   . Right ureteral stone   . Hydronephrosis, right   . Constipation   . Frequency of urination   . Urgency of urination   . Hematuria   . Rash     PSH: Past Surgical History  Procedure Laterality Date  . Cystoscopy w/ ureteral stent placement Right 05/11/2013    Procedure: CYSTOSCOPY WITH RETROGRADE PYELOGRAM/URETERAL STENT PLACEMENT, BLADDER BIOPSY;  Surgeon: Milford Cage, MD;  Location: [facility];  Service: Urology;  Laterality: Right;  cysto, bil retrogrades, stent placement   . Right ureteroscopic stone extraciton  03-23-2001  . Cardiac catheterization  09-05-2007  DR BERRY    NORMAL CORONARIES AND LVF  . Tubal ligation    . Tympanoplasty  AGE 25  . Abdominoplasty  JAN 2014    Allergies: Allergies  Allergen Reactions  . Tetracyclines & Related Hives  . Codeine Rash    Medications: No prescriptions prior to admission     Social History: History   Social History  . Marital  Status: Married    Spouse Name: N/A    Number of Children: N/A  . Years of Education: N/A   Occupational History  . Not on file.   Social History Main Topics  . Smoking status: Former Smoker -- 0.25 packs/day for 15 years    Types: Cigarettes    Quit date: 05/17/2007  . Smokeless tobacco: Never Used  . Alcohol Use: No  . Drug Use: No  . Sexually Active: Not on file   Other Topics Concern  . Not on file   Social History Narrative  . No narrative on file    Family History: Family History  Problem Relation Age of Onset  . Arthritis Mother   . Mental illness Mother   . Hypertension Mother   . Stroke Mother   . Diabetes Father   . Heart disease Father   . Hypertension Father   . Hypertension Brother   . Cancer Paternal Grandmother     Review of Systems: Positive: Dysphagia. Negative: Fever, chest pain, or SOB.  A further 10 point review of systems was negative except what is listed in the HPI.  Physical Exam: Filed Vitals:   05/18/13 0901  BP: 101/62  Pulse: 57  Temp: 98.3 F (36.8 C)  Resp: 16    General: No acute distress.  Awake. Head:  Normocephalic.  Atraumatic. ENT:  EOMI.  Mucous membranes moist Neck:  Supple.  No lymphadenopathy. CV:  S1 present. S2 present. Regular rate. Pulmonary: Equal effort bilaterally.  Clear to auscultation bilaterally. Abdomen: Soft.  Non- tender to palpation. Skin:  Normal turgor.  No visible rash. Extremity: No gross deformity of bilateral upper extremities.  No gross deformity of    bilateral lower extremities. Neurologic: Alert. Appropriate mood.   Studies:  No results found for this basename: HGB, WBC, PLT,  in the last 72 hours  No results found for this basename: NA, K, CL, CO2, BUN, CREATININE, CALCIUM, MAGNESIUM, GFRNONAA, GFRAA,  in the last 72 hours   No results found for this basename: PT, INR, APTT,  in the last 72 hours   No components found with this basename: ABG,     Assessment:  Right proximal  ureter stone.  Plan: To OR for for cystoscopy, right ureteroscopy, laser lithotripsy, right retrograde pyelogram, and right ureter stent exchange.

## 2013-05-18 NOTE — Transfer of Care (Signed)
Immediate Anesthesia Transfer of Care Note  Patient: Kathy Kim  Procedure(s) Performed: Procedure(s) with comments: CYSTOSCOPY WITH RETROGRADE PYELOGRAM, URETEROSCOPY AND STENT PLACEMENT (Right) - STENT EXCHANGE   HOLMIUM LASER APPLICATION (Right)  Patient Location: PACU  Anesthesia Type:General  Level of Consciousness: awake and oriented  Airway & Oxygen Therapy: Patient Spontanous Breathing and Patient connected to nasal cannula oxygen  Post-op Assessment: Report given to PACU RN  Post vital signs: Reviewed and stable  Complications: No apparent anesthesia complications

## 2013-05-18 NOTE — Op Note (Signed)
Urology Operative Report  Date of Procedure: 05/18/13  Surgeon: Natalia Leatherwood, MD Assistant:  None  Preoperative Diagnosis: Right ureter stone Postoperative Diagnosis:  Same  Procedure(s): Cystoscopy Right ureteroscopy Laser lithotripsy Right retrograde pyelogram Right ureter stent removal Right ureter stent placement (4.8 x 24)  Estimated blood loss: Minimal  Specimen: Stone sent for analysis at [facility] lab.  Drains: None  Complications: None  Findings: Impacted proximal right ureter stone.  History of present illness: 57 year old female presented with a right proximal obstructing ureter stone. She had a ureter stent placed last week. Due to intractable discomfort she presents today for ureteroscopy and laser lithotripsy of the stone.   Procedure in detail: After informed consent was obtained, the patient was taken to the operating room. They were placed in the supine position. SCDs were turned on and in place. IV antibiotics were infused, and general anesthesia was induced. A timeout was performed in which the correct patient, surgical site, and procedure were identified and agreed upon by the team.  The patient was placed in a dorsolithotomy position, making sure to pad all pertinent neurovascular pressure points. A belladonna and opium suppository was placed into the rectum. The genitals were prepped and draped in the usual sterile fashion.  A rigid cystoscope was advanced through the urethra and into the bladder. The right ureter stent was seen emanating from the right ureter orifice. It was grasped and brought to the urethral meatus. A sensor tip wire was placed through the stent and into the right renal pelvis on fluoroscopy. The stent was removed and then a second sensor-tip wire was placed up into the right renal pelvis to the cystoscope. The bladder was drained one wire was secured to the drape as a safety wire that was used as a working wire. A 12/14 ureter access sheath  was placed over the working wire under fluoroscopy into the proximal ureter where the stone was located. The obturator and wire were removed.  A flexible ureteroscope was advanced through the access sheath up into the proximal ureter. There was a large stone seen that appeared to be impacted in the proximal ureter with a large amount of edema surrounding the stone. The holmium laser filament was placed which was 200  in size and lithotripsy was carried out at 0.5 J and 20 Hz. After all the fragments were broken up small enough to be removed they were removed with a 0 tip Nitinol basket. The entirety of the kidney was visualized and there were no large fragments remaining. The access sheath and ureteroscope was then removed and the entire length of the ureter was visualized. There was no disruption of the mucosa noted. I then cannulated the right ureter orifice through cystoscope with a 5 [place] ureter catheter and injected contrast to obtain a retrograde PolyGram. There is no extravasation of contrast. There was good flow to the proximal ureter.  I then loaded the safety wire through the cystoscope and placed a 4.8 x 24 double-J ureter stent with tethering strings intact. The bladder was then drained and I placed 10 cc of lidocaine jelly into the urethra. She's placed back in supine position, anesthesia was reversed, and the strings were secured to her abdomen. She was taken to the [facility] in stable condition.  All counts were correct at the end of the case.

## 2013-05-18 NOTE — Anesthesia Preprocedure Evaluation (Signed)

## 2013-05-18 NOTE — Anesthesia Procedure Notes (Signed)
Procedure Name: LMA Insertion Date/Time: 05/18/2013 10:46 AM Performed by: Maris Berger T Pre-anesthesia Checklist: Patient identified, Emergency Drugs available, Suction available and Patient being monitored Patient Re-evaluated:Patient Re-evaluated prior to inductionOxygen Delivery Method: Circle System Utilized Preoxygenation: Pre-oxygenation with 100% oxygen Intubation Type: IV induction Ventilation: Mask ventilation without difficulty LMA: LMA inserted LMA Size: 4.0 Number of attempts: 1 Placement Confirmation: positive ETCO2 Dental Injury: Teeth and Oropharynx as per pre-operative assessment  Comments: Gauze roll between teeth

## 2013-05-18 NOTE — Anesthesia Postprocedure Evaluation (Signed)
Anesthesia Post Note  Patient: Kathy Kim  Procedure(s) Performed: Procedure(s) (LRB): CYSTOSCOPY WITH RETROGRADE PYELOGRAM, URETEROSCOPY AND STENT PLACEMENT (Right) HOLMIUM LASER APPLICATION (Right)  Anesthesia type: General  Patient location: PACU  Post pain: Pain level controlled  Post assessment: Post-op Vital signs reviewed  Last Vitals:  Filed Vitals:   05/18/13 1315  BP: 103/69  Pulse: 67  Temp:   Resp: 9    Post vital signs: Reviewed  Level of consciousness: sedated  Complications: No apparent anesthesia complications

## 2013-05-19 ENCOUNTER — Encounter (HOSPITAL_BASED_OUTPATIENT_CLINIC_OR_DEPARTMENT_OTHER): Payer: Self-pay | Admitting: Urology

## 2013-05-19 NOTE — Anesthesia Postprocedure Evaluation (Signed)
  Anesthesia Post-op Note  Patient: Kathy Kim  Procedure(s) Performed: Procedure(s) (LRB): CYSTOSCOPY WITH RETROGRADE PYELOGRAM/URETERAL STENT PLACEMENT, BLADDER BIOPSY (Right)  Patient Location: PACU  Anesthesia Type: General  Level of Consciousness: awake and alert   Airway and Oxygen Therapy: Patient Spontanous Breathing  Post-op Pain: mild  Post-op Assessment: Post-op Vital signs reviewed, Patient's Cardiovascular Status Stable, Respiratory Function Stable, Patent Airway and No signs of Nausea or vomiting  Last Vitals:  Filed Vitals:   05/11/13 1518  BP: 128/77  Pulse: 53  Temp: 36.2 C  Resp:     Post-op Vital Signs: stable   Complications: No apparent anesthesia complications

## 2013-05-23 ENCOUNTER — Encounter (HOSPITAL_COMMUNITY): Payer: Self-pay | Admitting: *Deleted

## 2013-05-23 ENCOUNTER — Encounter (HOSPITAL_COMMUNITY): Payer: Self-pay | Admitting: Certified Registered"

## 2013-05-23 ENCOUNTER — Inpatient Hospital Stay (HOSPITAL_COMMUNITY): Payer: 59 | Admitting: Certified Registered"

## 2013-05-23 ENCOUNTER — Encounter (HOSPITAL_COMMUNITY)
Admission: EM | Disposition: A | Discharge status: Home / Self Care | Payer: Self-pay | Source: Home / Self Care | Attending: Urology

## 2013-05-23 ENCOUNTER — Other Ambulatory Visit: Payer: Self-pay | Admitting: Urology

## 2013-05-23 ENCOUNTER — Inpatient Hospital Stay (HOSPITAL_COMMUNITY)
Admission: EM | Admit: 2013-05-23 | Discharge: 2013-05-23 | DRG: 694 | Disposition: A | Discharge status: Home / Self Care | Payer: 59 | Attending: Urology | Admitting: Urology

## 2013-05-23 ENCOUNTER — Emergency Department (HOSPITAL_COMMUNITY): Payer: 59

## 2013-05-23 DIAGNOSIS — N201 Calculus of ureter: Principal | ICD-10-CM | POA: Diagnosis present

## 2013-05-23 DIAGNOSIS — N23 Unspecified renal colic: Secondary | ICD-10-CM

## 2013-05-23 DIAGNOSIS — K219 Gastro-esophageal reflux disease without esophagitis: Secondary | ICD-10-CM | POA: Diagnosis present

## 2013-05-23 DIAGNOSIS — N1330 Unspecified hydronephrosis: Secondary | ICD-10-CM | POA: Diagnosis present

## 2013-05-23 HISTORY — PX: CYSTOSCOPY W/ URETERAL STENT PLACEMENT: SHX1429

## 2013-05-23 LAB — URINALYSIS, ROUTINE W REFLEX MICROSCOPIC
Bilirubin Urine: NEGATIVE
Ketones, ur: NEGATIVE mg/dL
Nitrite: NEGATIVE
Urobilinogen, UA: 0.2 mg/dL (ref 0.0–1.0)

## 2013-05-23 LAB — SURGICAL PCR SCREEN
MRSA, PCR: NEGATIVE
Staphylococcus aureus: NEGATIVE

## 2013-05-23 LAB — APTT: aPTT: 29 seconds (ref 24–37)

## 2013-05-23 LAB — CBC WITH DIFFERENTIAL/PLATELET
Lymphocytes Relative: 23 % (ref 12–46)
Lymphs Abs: 2.7 10*3/uL (ref 0.7–4.0)
MCV: 85.8 fL (ref 78.0–100.0)
Neutro Abs: 8.1 10*3/uL — ABNORMAL HIGH (ref 1.7–7.7)
Neutrophils Relative %: 67 % (ref 43–77)
Platelets: 333 10*3/uL (ref 150–400)
RBC: 4.66 MIL/uL (ref 3.87–5.11)
WBC: 12.1 10*3/uL — ABNORMAL HIGH (ref 4.0–10.5)

## 2013-05-23 LAB — COMPREHENSIVE METABOLIC PANEL
ALT: 15 U/L (ref 0–35)
Alkaline Phosphatase: 88 U/L (ref 39–117)
CO2: 22 mEq/L (ref 19–32)
Chloride: 105 mEq/L (ref 96–112)
GFR calc Af Amer: 75 mL/min — ABNORMAL LOW (ref >90)
GFR calc non Af Amer: 65 mL/min — ABNORMAL LOW (ref >90)
Glucose, Bld: 140 mg/dL — ABNORMAL HIGH (ref 70–99)
Potassium: 3.7 mEq/L (ref 3.5–5.1)
Sodium: 139 mEq/L (ref 135–145)

## 2013-05-23 LAB — URINE MICROSCOPIC-ADD ON

## 2013-05-23 LAB — PROTIME-INR: Prothrombin Time: 14 seconds (ref 11.6–15.2)

## 2013-05-23 SURGERY — CYSTOSCOPY, WITH RETROGRADE PYELOGRAM AND URETERAL STENT INSERTION
Anesthesia: General | Site: Ureter | Laterality: Bilateral | Wound class: Clean Contaminated

## 2013-05-23 MED ORDER — IOHEXOL 300 MG/ML  SOLN
INTRAMUSCULAR | Status: AC
  Filled 2013-05-23: qty 1

## 2013-05-23 MED ORDER — ONDANSETRON HCL 4 MG/2ML IJ SOLN
INTRAMUSCULAR | Status: DC | PRN
  Administered 2013-05-23: 4 mg via INTRAVENOUS

## 2013-05-23 MED ORDER — FENTANYL CITRATE 0.05 MG/ML IJ SOLN
INTRAMUSCULAR | Status: DC | PRN
  Administered 2013-05-23: 50 ug via INTRAVENOUS
  Administered 2013-05-23: 100 ug via INTRAVENOUS
  Administered 2013-05-23: 50 ug via INTRAVENOUS
  Administered 2013-05-23 (×2): 25 ug via INTRAVENOUS

## 2013-05-23 MED ORDER — PROMETHAZINE HCL 25 MG/ML IJ SOLN: 6.2500 mg | INTRAMUSCULAR | Status: DC | PRN

## 2013-05-23 MED ORDER — CEFAZOLIN SODIUM 1-5 GM-% IV SOLN
INTRAVENOUS | Status: AC
  Filled 2013-05-23: qty 50

## 2013-05-23 MED ORDER — METOCLOPRAMIDE HCL 5 MG/ML IJ SOLN
INTRAMUSCULAR | Status: DC | PRN
  Administered 2013-05-23: 10 mg via INTRAVENOUS

## 2013-05-23 MED ORDER — PROPOFOL 10 MG/ML IV BOLUS
INTRAVENOUS | Status: DC | PRN
  Administered 2013-05-23: 150 mg via INTRAVENOUS
  Administered 2013-05-23: 50 mg via INTRAVENOUS

## 2013-05-23 MED ORDER — MORPHINE SULFATE 4 MG/ML IJ SOLN
4.0000 mg | Freq: Once | INTRAMUSCULAR | Status: AC
  Administered 2013-05-23: 4 mg via INTRAVENOUS
  Filled 2013-05-23: qty 1

## 2013-05-23 MED ORDER — CEFAZOLIN SODIUM-DEXTROSE 2-3 GM-% IV SOLR
INTRAVENOUS | Status: DC | PRN
  Administered 2013-05-23: 2 g via INTRAVENOUS

## 2013-05-23 MED ORDER — HYDROMORPHONE HCL PF 1 MG/ML IJ SOLN
INTRAMUSCULAR | Status: AC
  Filled 2013-05-23: qty 1

## 2013-05-23 MED ORDER — STERILE WATER FOR IRRIGATION IR SOLN
Status: DC | PRN
  Administered 2013-05-23: 3000 mL

## 2013-05-23 MED ORDER — FENTANYL CITRATE 0.05 MG/ML IJ SOLN
25.0000 ug | INTRAMUSCULAR | Status: DC | PRN
  Administered 2013-05-23: 50 ug via INTRAVENOUS

## 2013-05-23 MED ORDER — ONDANSETRON HCL 4 MG/2ML IJ SOLN
4.0000 mg | Freq: Once | INTRAMUSCULAR | Status: AC
  Administered 2013-05-23: 4 mg via INTRAVENOUS
  Filled 2013-05-23: qty 2

## 2013-05-23 MED ORDER — FENTANYL CITRATE 0.05 MG/ML IJ SOLN
INTRAMUSCULAR | Status: AC
  Administered 2013-05-23: 50 ug
  Filled 2013-05-23: qty 2

## 2013-05-23 MED ORDER — TRAMADOL-ACETAMINOPHEN 37.5-325 MG PO TABS: 1.0000 | ORAL_TABLET | Freq: Four times a day (QID) | ORAL | Status: DC | PRN

## 2013-05-23 MED ORDER — PROMETHAZINE HCL 25 MG/ML IJ SOLN
25.0000 mg | Freq: Once | INTRAMUSCULAR | Status: AC
  Administered 2013-05-23: 25 mg via INTRAVENOUS
  Filled 2013-05-23: qty 1

## 2013-05-23 MED ORDER — IOHEXOL 300 MG/ML  SOLN
INTRAMUSCULAR | Status: DC | PRN
  Administered 2013-05-23: 10 mL

## 2013-05-23 MED ORDER — SUCCINYLCHOLINE CHLORIDE 20 MG/ML IJ SOLN
INTRAMUSCULAR | Status: DC | PRN
  Administered 2013-05-23: 100 mg via INTRAVENOUS
  Administered 2013-05-23: 50 mg via INTRAVENOUS

## 2013-05-23 MED ORDER — SODIUM CHLORIDE 0.9 % IV SOLN
INTRAVENOUS | Status: DC
  Administered 2013-05-23 (×2): via INTRAVENOUS

## 2013-05-23 MED ORDER — BELLADONNA ALKALOIDS-OPIUM 16.2-60 MG RE SUPP
RECTAL | Status: DC | PRN
  Administered 2013-05-23: 1 via RECTAL

## 2013-05-23 MED ORDER — MIDAZOLAM HCL 5 MG/5ML IJ SOLN
INTRAMUSCULAR | Status: DC | PRN
  Administered 2013-05-23: 2 mg via INTRAVENOUS

## 2013-05-23 MED ORDER — LACTATED RINGERS IV SOLN
INTRAVENOUS | Status: DC | PRN
  Administered 2013-05-23: 21:00:00 via INTRAVENOUS

## 2013-05-23 MED ORDER — TAMSULOSIN HCL 0.4 MG PO CAPS: 0.4000 mg | ORAL_CAPSULE | Freq: Every day | ORAL | Status: DC

## 2013-05-23 MED ORDER — DEXAMETHASONE SODIUM PHOSPHATE 4 MG/ML IJ SOLN
INTRAMUSCULAR | Status: DC | PRN
  Administered 2013-05-23: 10 mg via INTRAVENOUS

## 2013-05-23 MED ORDER — TRIMETHOPRIM 100 MG PO TABS: 100.0000 mg | ORAL_TABLET | ORAL | Status: DC

## 2013-05-23 MED ORDER — BELLADONNA ALKALOIDS-OPIUM 16.2-60 MG RE SUPP
RECTAL | Status: AC
  Filled 2013-05-23: qty 1

## 2013-05-23 SURGICAL SUPPLY — 19 items
ADAPTER CATH URET PLST 4-6FR (CATHETERS) ×2 IMPLANT
ADPR CATH URET STRL DISP 4-6FR (CATHETERS) ×1
BAG URO CATCHER STRL LF (DRAPE) ×2 IMPLANT
CATH INTERMIT  6FR 70CM (CATHETERS) ×2 IMPLANT
CLOTH BEACON ORANGE TIMEOUT ST (SAFETY) ×2 IMPLANT
DRAPE CAMERA CLOSED 9X96 (DRAPES) ×2 IMPLANT
GLOVE BIOGEL M STRL SZ7.5 (GLOVE) ×2 IMPLANT
GLOVE SURG SS PI 8.5 STRL IVOR (GLOVE) ×1
GLOVE SURG SS PI 8.5 STRL STRW (GLOVE) ×1 IMPLANT
GOWN STRL NON-REIN LRG LVL3 (GOWN DISPOSABLE) ×2 IMPLANT
GOWN STRL REIN XL XLG (GOWN DISPOSABLE) ×2 IMPLANT
GUIDEWIRE STR DUAL SENSOR (WIRE) ×2 IMPLANT
MANIFOLD NEPTUNE II (INSTRUMENTS) ×2 IMPLANT
NS IRRIG 1000ML POUR BTL (IV SOLUTION) ×2 IMPLANT
PACK CYSTO (CUSTOM PROCEDURE TRAY) ×2 IMPLANT
SCRUB PCMX 4 OZ (MISCELLANEOUS) IMPLANT
STENT POLARIS 5FRX24 (STENTS) ×4 IMPLANT
TUBING CONNECTING 10 (TUBING) ×2 IMPLANT
WATER STERILE IRR 3000ML UROMA (IV SOLUTION) ×2 IMPLANT

## 2013-05-23 NOTE — H&P (Signed)
Urology Admission H&P  Chief Complaint: 3 days of nausea/vomiting.  History of Present Illness: 57 YO female patient of Dr. Hilario Quarry seen today in the ER for 3 days of nausea/vomiting. She is status post cystoureterscopy, right retrograde pyelogram, stone extraction, and placement of double J stent 05/18/13. She removed stent at home 5/312/14 as instructed and did well until 05/21/13 when she developed nausea/vomiting and "thought I had food poisioning."   Past Medical History  Diagnosis Date  . GERD (gastroesophageal reflux disease)   . History of kidney stones   . Right ureteral stone   . Hydronephrosis, right   . Constipation   . Frequency of urination   . Urgency of urination   . Hematuria   . Rash    Past Surgical History  Procedure Laterality Date  . Cystoscopy w/ ureteral stent placement Right 05/11/2013    Procedure: CYSTOSCOPY WITH RETROGRADE PYELOGRAM/URETERAL STENT PLACEMENT, BLADDER BIOPSY;  Surgeon: Milford Cage, MD;  Location: [facility];  Service: Urology;  Laterality: Right;  cysto, bil retrogrades, stent placement   . Right ureteroscopic stone extraciton  03-23-2001  . Cardiac catheterization  09-05-2007  DR BERRY    NORMAL CORONARIES AND LVF  . Tubal ligation    . Tympanoplasty  AGE 57  . Abdominoplasty  JAN 2014  . Cystoscopy with retrograde pyelogram, ureteroscopy and stent placement Right 05/18/2013    Procedure: CYSTOSCOPY WITH RETROGRADE PYELOGRAM, URETEROSCOPY AND STENT PLACEMENT;  Surgeon: Milford Cage, MD;  Location: [facility];  Service: Urology;  Laterality: Right;  STENT EXCHANGE    . Holmium laser application Right 05/18/2013    Procedure: HOLMIUM LASER APPLICATION;  Surgeon: Milford Cage, MD;  Location: [facility];  Service: Urology;  Laterality: Right;    Home Medications:   (Not in a hospital admission) Allergies:  Allergies  Allergen Reactions  . Tetracyclines & Related  Hives  . Codeine Rash    Family History  Problem Relation Age of Onset  . Arthritis Mother   . Mental illness Mother   . Hypertension Mother   . Stroke Mother   . Diabetes Father   . Heart disease Father   . Hypertension Father   . Hypertension Brother   . Cancer Paternal Grandmother    Social History:  reports that she quit smoking about 6 years ago. Her smoking use included Cigarettes. She has a 3.75 pack-year smoking history. She has never used smokeless tobacco. She reports that she does not drink alcohol or use illicit drugs.  Review of Systems  Constitutional: Negative.   HENT: Negative.   Eyes: Negative.   Respiratory: Negative.   Cardiovascular: Negative.   Gastrointestinal: Positive for nausea, vomiting and abdominal pain.  Genitourinary: Negative.   Musculoskeletal: Negative.   Skin: Negative.   Neurological: Negative.   Endo/Heme/Allergies: Negative.   Psychiatric/Behavioral: Negative.     Physical Exam:  Vital signs in last 24 hours: Temp:  [97.8 F (36.6 C)-98 F (36.7 C)] 98 F (36.7 C) (06/03 1146) Pulse Rate:  [59-66] 59 (06/03 1146) Resp:  [16-18] 16 (06/03 1146) BP: (99-124)/(65) 99/65 mmHg (06/03 1146) SpO2:  [98 %-100 %] 98 % (06/03 1146) Physical Exam  Constitutional: She is oriented to person, place, and time. She appears well-developed and well-nourished. No distress.  HENT:  Head: Normocephalic.  Neck: Normal range of motion.  Cardiovascular: Normal rate.   Respiratory: Effort normal.  GI: Soft. Bowel sounds are normal.  Musculoskeletal: Normal range of  motion.  Neurological: She is alert and oriented to person, place, and time.  Skin: Skin is warm and dry.  Psychiatric: She has a normal mood and affect.    Laboratory Data:  Results for orders placed during the hospital encounter of 05/23/13 (from the past 24 hour(s))  CBC WITH DIFFERENTIAL     Status: Abnormal   Collection Time    05/23/13  7:48 AM      Result Value Range   WBC  12.1 (*) 4.0 - 10.5 K/uL   RBC 4.66  3.87 - 5.11 MIL/uL   Hemoglobin 13.3  12.0 - 15.0 g/dL   HCT 16.1  09.6 - 04.5 %   MCV 85.8  78.0 - 100.0 fL   MCH 28.5  26.0 - 34.0 pg   MCHC 33.3  30.0 - 36.0 g/dL   RDW 40.9  81.1 - 91.4 %   Platelets 333  150 - 400 K/uL   Neutrophils Relative % 67  43 - 77 %   Neutro Abs 8.1 (*) 1.7 - 7.7 K/uL   Lymphocytes Relative 23  12 - 46 %   Lymphs Abs 2.7  0.7 - 4.0 K/uL   Monocytes Relative 8  3 - 12 %   Monocytes Absolute 1.0  0.1 - 1.0 K/uL   Eosinophils Relative 2  0 - 5 %   Eosinophils Absolute 0.2  0.0 - 0.7 K/uL   Basophils Relative 0  0 - 1 %   Basophils Absolute 0.1  0.0 - 0.1 K/uL  COMPREHENSIVE METABOLIC PANEL     Status: Abnormal   Collection Time    05/23/13  7:48 AM      Result Value Range   Sodium 139  135 - 145 mEq/L   Potassium 3.7  3.5 - 5.1 mEq/L   Chloride 105  96 - 112 mEq/L   CO2 22  19 - 32 mEq/L   Glucose, Bld 140 (*) 70 - 99 mg/dL   BUN 20  6 - 23 mg/dL   Creatinine, Ser 7.82  0.50 - 1.10 mg/dL   Calcium 8.8  8.4 - 95.6 mg/dL   Total Protein 6.8  6.0 - 8.3 g/dL   Albumin 3.5  3.5 - 5.2 g/dL   AST 16  0 - 37 U/L   ALT 15  0 - 35 U/L   Alkaline Phosphatase 88  39 - 117 U/L   Total Bilirubin 0.4  0.3 - 1.2 mg/dL   GFR calc non Af Amer 65 (*) >90 mL/min   GFR calc Af Amer 75 (*) >90 mL/min  LIPASE, BLOOD     Status: None   Collection Time    05/23/13  7:48 AM      Result Value Range   Lipase 19  11 - 59 U/L  URINALYSIS, ROUTINE W REFLEX MICROSCOPIC     Status: Abnormal   Collection Time    05/23/13  8:13 AM      Result Value Range   Color, Urine AMBER (*) YELLOW   APPearance CLOUDY (*) CLEAR   Specific Gravity, Urine 1.022  1.005 - 1.030   pH 5.5  5.0 - 8.0   Glucose, UA NEGATIVE  NEGATIVE mg/dL   Hgb urine dipstick LARGE (*) NEGATIVE   Bilirubin Urine NEGATIVE  NEGATIVE   Ketones, ur NEGATIVE  NEGATIVE mg/dL   Protein, ur 30 (*) NEGATIVE mg/dL   Urobilinogen, UA 0.2  0.0 - 1.0 mg/dL   Nitrite NEGATIVE   NEGATIVE  Leukocytes, UA SMALL (*) NEGATIVE  URINE MICROSCOPIC-ADD ON     Status: Abnormal   Collection Time    05/23/13  8:13 AM      Result Value Range   Squamous Epithelial / LPF RARE  RARE   WBC, UA 3-6  <3 WBC/hpf   RBC / HPF 11-20  <3 RBC/hpf   Bacteria, UA FEW (*) RARE   Urine-Other RARE YEAST    PROTIME-INR     Status: None   Collection Time    05/23/13  9:50 AM      Result Value Range   Prothrombin Time 14.0  11.6 - 15.2 seconds   INR 1.09  0.00 - 1.49  APTT     Status: None   Collection Time    05/23/13  9:50 AM      Result Value Range   aPTT 29  24 - 37 seconds   No results found for this or any previous visit (from the past 240 hour(s)). Creatinine:  Recent Labs  05/18/13 0955 05/23/13 0748  CREATININE 0.90 0.96   Baseline Creatinine: 20/0.96  Impression/Assessment:  Bilateral ureteral calculus with improving right hydronephrosis and mild left hydronephrosis.   Plan Bilateral ureteral calculus with bilateral hydronephrosis. Will have pt remain NPO. Will proceed with cystoscopy, bilateral ureteroscopy, bilateral retrograde pyelograms, bilateral stone extraction, and bilateral double J stent placement by Dr. Patsi Sears tonight. If pain and nausea controlled post procedure will discharge home to follow-up with Dr. Margarita Grizzle. Risks and benefits discussed which include: hematuria, infection, inability to remove stones, possible bladder or ureteral injury and possible need for follow-up procedure ie ESWL or repeat cystoureterscopy, retrograde pyelograms, and stone extraction at later date by Dr. Margarita Grizzle. All questions answered to both patient's and family's satisfaction and wishes to proceed.   Evone Arseneau 05/23/2013, 2:03 PM

## 2013-05-23 NOTE — Transfer of Care (Signed)
Immediate Anesthesia Transfer of Care Note  Patient: Lawanna Kobus  Procedure(s) Performed: Procedure(s): CYSTOSCOPY WITH RETROGRADE PYELOGRAM/URETERAL STENT PLACEMENT (Bilateral)  Patient Location: PACU  Anesthesia Type:General  Level of Consciousness: sedated, patient cooperative and responds to stimulation, drowsy, ventilating well  Airway & Oxygen Therapy: Patient Spontanous Breathing and Patient connected to face mask oxygen  Post-op Assessment: Report given to PACU RN, Post -op Vital signs reviewed and stable and Patient moving all extremities X 4  Post vital signs: Reviewed and stable  Complications: No apparent anesthesia complications

## 2013-05-23 NOTE — ED Notes (Signed)
Per EMS pt having L sided flank pain radiating around to abdominal pain, last Thursday had stents placed on R side for kidney stones, L sided pain started about hour ago, has been constipated, before sharp pain had dull pain and didn't feel good, BP 116/74, HR 60-80's, L AC 20G 200 mcg fentanyl given, 200 cc NS given.

## 2013-05-23 NOTE — ED Provider Notes (Addendum)
History     CSN: [id number]  Arrival date & time 05/23/13  4098   First MD Initiated Contact with Patient 05/23/13 [phone]      Chief Complaint  Patient presents with  . Flank Pain  . Abdominal Pain    (Consider location/radiation/quality/duration/timing/severity/associated sxs/prior treatment) HPI Comments: Patient comes to the ER for evaluation of abdominal pain. Patient reports that pain began approximately an hour ago. Pain is more left-sided and flank area, but there is diffuse upper abdominal discomfort as well. Patient's husband reports that she has been constipated because of pain medication she has been taking for recent kidney stone. She had retrieval of a right-sided kidney stone 5 days ago. She has been doing well postoperatively.   Patient is a 57 y.o. female presenting with flank pain and abdominal pain.  Flank Pain Associated symptoms include abdominal pain.  Abdominal Pain Associated symptoms include abdominal pain.    Past Medical History  Diagnosis Date  . GERD (gastroesophageal reflux disease)   . History of kidney stones   . Right ureteral stone   . Hydronephrosis, right   . Constipation   . Frequency of urination   . Urgency of urination   . Hematuria   . Rash     Past Surgical History  Procedure Laterality Date  . Cystoscopy w/ ureteral stent placement Right 05/11/2013    Procedure: CYSTOSCOPY WITH RETROGRADE PYELOGRAM/URETERAL STENT PLACEMENT, BLADDER BIOPSY;  Surgeon: Milford Cage, MD;  Location: [facility];  Service: Urology;  Laterality: Right;  cysto, bil retrogrades, stent placement   . Right ureteroscopic stone extraciton  03-23-2001  . Cardiac catheterization  09-05-2007  DR BERRY    NORMAL CORONARIES AND LVF  . Tubal ligation    . Tympanoplasty  AGE 57  . Abdominoplasty  JAN 2014  . Cystoscopy with retrograde pyelogram, ureteroscopy and stent placement Right 05/18/2013    Procedure: CYSTOSCOPY WITH RETROGRADE PYELOGRAM,  URETEROSCOPY AND STENT PLACEMENT;  Surgeon: Milford Cage, MD;  Location: [facility];  Service: Urology;  Laterality: Right;  STENT EXCHANGE    . Holmium laser application Right 05/18/2013    Procedure: HOLMIUM LASER APPLICATION;  Surgeon: Milford Cage, MD;  Location: [facility];  Service: Urology;  Laterality: Right;    Family History  Problem Relation Age of Onset  . Arthritis Mother   . Mental illness Mother   . Hypertension Mother   . Stroke Mother   . Diabetes Father   . Heart disease Father   . Hypertension Father   . Hypertension Brother   . Cancer Paternal Grandmother     History  Substance Use Topics  . Smoking status: Former Smoker -- 0.25 packs/day for 15 years    Types: Cigarettes    Quit date: 05/17/2007  . Smokeless tobacco: Never Used  . Alcohol Use: No    OB History   Grav Para Term Preterm Abortions TAB SAB Ect Mult Living                  Review of Systems  Constitutional: Negative for fever.  Gastrointestinal: Positive for abdominal pain.  Genitourinary: Positive for flank pain.  Musculoskeletal: Positive for back pain.  All other systems reviewed and are negative.    Allergies  Tetracyclines & related and Codeine  Home Medications   Current Outpatient Rx  Name  Route  Sig  Dispense  Refill  . acidophilus (RISAQUAD) CAPS   Oral   Take  2 capsules by mouth daily.         Marland Kitchen allopurinol (ZYLOPRIM) 100 MG tablet   Oral   Take 100 mg by mouth daily.          Marland Kitchen aspirin 81 MG tablet   Oral   Take 81 mg by mouth daily.         . cephALEXin (KEFLEX) 500 MG capsule   Oral   Take 1 capsule (500 mg total) by mouth 3 (three) times daily.   15 capsule   0   . co-enzyme Q-10 30 MG capsule   Oral   Take 30 mg by mouth daily.         Marland Kitchen HYDROmorphone (DILAUDID) 4 MG tablet   Oral   Take 1 tablet (4 mg total) by mouth every 4 (four) hours as needed for pain.   60 tablet   0   .  hyoscyamine (LEVSIN, ANASPAZ) 0.125 MG tablet   Oral   Take 0.125 mg by mouth every 4 (four) hours as needed for cramping (bladder spasms).         Marland Kitchen oxybutynin (DITROPAN) 5 MG tablet   Oral   Take 1 tablet (5 mg total) by mouth every 6 (six) hours as needed.   40 tablet   4   . phenazopyridine (PYRIDIUM) 100 MG tablet   Oral   Take 1 tablet (100 mg total) by mouth every 8 (eight) hours as needed for pain (Burning urination.  Will turn urine and body fluids orange.).   30 tablet   1   . polyethylene glycol (MIRALAX / GLYCOLAX) packet   Oral   Take 17 g by mouth daily.         Marland Kitchen senna-docusate (SENOKOT S) 8.6-50 MG per tablet   Oral   Take 1 tablet by mouth 2 (two) times daily.   60 tablet   0   . simvastatin (ZOCOR) 40 MG tablet   Oral   Take 40 mg by mouth at bedtime.            BP 124/65  Pulse 66  Temp(Src) 97.8 F (36.6 C) (Oral)  Resp 18  SpO2 100%  Physical Exam  Constitutional: She is oriented to person, place, and time. She appears well-developed and well-nourished. She appears distressed.  HENT:  Head: Normocephalic and atraumatic.  Right Ear: Hearing normal.  Left Ear: Hearing normal.  Nose: Nose normal.  Mouth/Throat: Oropharynx is clear and moist and mucous membranes are normal.  Eyes: Conjunctivae and EOM are normal. Pupils are equal, round, and reactive to light.  Neck: Normal range of motion. Neck supple.  Cardiovascular: Regular rhythm, S1 normal and S2 normal.  Exam reveals no gallop and no friction rub.   No murmur heard. Pulmonary/Chest: Effort normal and breath sounds normal. No respiratory distress. She exhibits no tenderness.  Abdominal: Soft. Normal appearance and bowel sounds are normal. There is no hepatosplenomegaly. There is generalized tenderness. There is no rebound, no guarding, no tenderness at McBurney's point and negative Murphy's sign. No hernia.  Musculoskeletal: Normal range of motion.  Neurological: She is alert and  oriented to person, place, and time. She has normal strength. No cranial nerve deficit or sensory deficit. Coordination normal. GCS eye subscore is 4. GCS verbal subscore is 5. GCS motor subscore is 6.  Skin: Skin is warm, dry and intact. No rash noted. No cyanosis.  Psychiatric: She has a normal mood and affect. Her speech is normal and behavior  is normal. Thought content normal.    ED Course  Procedures (including critical care time)  EKG:  Date: 05/23/2013  Rate: 65  Rhythm: normal sinus rhythm  QRS Axis: normal  Intervals: normal  ST/T Wave abnormalities: normal  Conduction Disutrbances:none     Labs Reviewed  CBC WITH DIFFERENTIAL - Abnormal; Notable for the following:    WBC 12.1 (*)    Neutro Abs 8.1 (*)    All other components within normal limits  COMPREHENSIVE METABOLIC PANEL - Abnormal; Notable for the following:    Glucose, Bld 140 (*)    GFR calc non Af Amer 65 (*)    GFR calc Af Amer 75 (*)    All other components within normal limits  URINALYSIS, ROUTINE W REFLEX MICROSCOPIC - Abnormal; Notable for the following:    Color, Urine AMBER (*)    APPearance CLOUDY (*)    Hgb urine dipstick LARGE (*)    Protein, ur 30 (*)    Leukocytes, UA SMALL (*)    All other components within normal limits  URINE MICROSCOPIC-ADD ON - Abnormal; Notable for the following:    Bacteria, UA FEW (*)    All other components within normal limits  URINE CULTURE  LIPASE, BLOOD  PROTIME-INR  APTT   Ct Abdomen Pelvis Wo Contrast  05/23/2013   *RADIOLOGY REPORT*  Clinical Data: Left flank pain.  Abdominal pain.  CT ABDOMEN AND PELVIS WITHOUT CONTRAST  Technique:  Multidetector CT imaging of the abdomen and pelvis was performed following the standard protocol without intravenous contrast.  Comparison: 05/11/2013  Findings: There is new left hydronephrosis with a 3.7 mm stone obstructing the proximal left ureter.  The stone came from the left lower pole.  There are several other tiny left  renal stones.  Right hydronephrosis has diminished since the prior study. However, there are two small stone fragments in the right ureteropelvic junction as well as multiple stone fragments in the lower pole of the right kidney.  No distal right ureteral stones.  There is a 9 mm area of lucency in the posterior aspect of the right lobe of the liver which may be artifactual.  Liver is otherwise normal as is the biliary tree.  The spleen, pancreas, adrenal glands are normal.  The bowel is normal except for a few diverticula in the distal colon.  Uterus and ovaries are normal.  No significant osseous abnormality.  IMPRESSION:  1.  3.7 mm stone obstructing the proximal left ureter at the L3 level. 2.  Decreased right hydronephrosis with at least two small stone fragments in the right ureteropelvic junction. 3.  Multiple small stone fragments in the lower pole of the right kidney and multiple tiny left ureteral calculi.   Original Report Authenticated By: Francene Boyers, M.D.     Diagnosis: Renal colic secondary to left ureterolithiasis    MDM  Patient presents to the ER for evaluation of new onset left flank pain. Patient reports that she was recently treated for a right-sided kidney stone, had a stent placed for several days last week. Patient has been having problems with diffuse abdominal cramping, felt to be secondary to constipation from the pain medication. This morning, however, she had onset of severe stabbing pain in the left flank. Workup has indicated that she has new left ureterolithiasis with some associated hydronephrosis. Patient required multiple doses of narcotic analgesia. She was given fentanyl by EMS and morphine here in the ER. She is now comfortable. CAT scan shows  that she has multiple stones in the lower pole of the right kidney. This is concerning because she could possibly pass the stone on the right side and have bilateral obstructions. Case was therefore discussed with Doctor  Patsi Sears. He is recommending bilateral ureteral stents, will see the patient in the ER and take her to the OR.        Gilda Crease, MD 05/23/13 9562  Gilda Crease, MD 05/23/13 1001

## 2013-05-23 NOTE — Op Note (Signed)
Pre-operative diagnosis :   Right UPJ stones x2 and 3.7 mm left proximal ureteral stone with hydronephrosis  Postoperative diagnosis:  Same  Operation:  Cystourethroscopy, bilateral retrograde PyeloGram with interpretation, insertion of bilateral Polaris stent, 5 [place] by 24 cm.  Surgeon:  Kathie Rhodes. Kathy Sears, MD  First assistant:  None  Anesthesia:  General LMA  Preparation:  After appropriate preanesthesia, the patient was brought to the operating room, placed on the operating table in the dorsal supine position where general LMA anesthesia was introduced. She was then replaced in the dorsal lithotomy position with pubis was prepped with Betadine solution and draped in usual fashion. History was reviewed, and armband was double checked.  Review history:  History of Present Illness: 57 YO female patient of Dr. Hilario Quarry seen today in the ER for 3 days of nausea/vomiting. She is status post cystoureterscopy, right retrograde pyelogram, stone extraction, and placement of double J stent 05/18/13. She removed stent at home 5/312/14 as instructed and did well until 05/21/13 when she developed nausea/vomiting and "thought I had food poisioning."  Past Medical History   Diagnosis  Date   .  GERD (gastroesophageal reflux disease)    .  History of kidney stones    .  Right ureteral stone    .  Hydronephrosis, right    .  Constipation    .  Frequency of urination    .  Urgency of urination    .  Hematuria    .  Rash     Past Surgical History   Procedure  Laterality  Date   .  Cystoscopy w/ ureteral stent placement  Right  05/11/2013      Statement of  Likelihood of Success: Excellent. TIME-OUT observed.:  Procedure:  Cystourethroscopy was accomplished which showed normal appearing bladder base. Bilateral retrograde Pyelograms were performed. Stones were identified in the proximal ureter on the left, and in the right ureteropelvic junction. Guidewires were passed bilaterally into the renal pelvis,  and over the guidewires, 2 separate Polaris stents were placed, measuring 5 [place] by 24 cm each. These were placed under fluoroscopic control. The bladder is drained of fluid. A small stone was recovered from the left ureter, and this is sent for analysis.  The bladder is drained of fluid, the patient was given a B. and O. suppository, IV Toradol, and awakened and taken to recovery room in good condition.

## 2013-05-23 NOTE — Anesthesia Preprocedure Evaluation (Addendum)
Anesthesia Evaluation  Patient identified by MRN, date of birth, ID band Patient awake  General Assessment Comment:.  GERD (gastroesophageal reflux disease)     .  History of kidney stones     .  Right ureteral stone     .  Hydronephrosis, right     .  Constipation     .  Frequency of urination     .  Urgency of urination     .  Hematuria     .  Rash     Reviewed: Allergy & Precautions, H&P , NPO status , Patient's Chart, lab work & pertinent test results  Airway Mallampati: II TM Distance: >3 FB Neck ROM: Full    Dental no notable dental hx.    Pulmonary former smoker,  CXR: pulmonary vascular congestion without edema. breath sounds clear to auscultation  Pulmonary exam normal       Cardiovascular Exercise Tolerance: Good negative cardio ROS  Rhythm:Regular Rate:Normal  ECG: normal.  2008 Catheterization: normal coronaries and EF.   Neuro/Psych negative neurological ROS  negative psych ROS   GI/Hepatic Neg liver ROS, GERD-  ,  Endo/Other  negative endocrine ROS  Renal/GU Renal disease  negative genitourinary   Musculoskeletal negative musculoskeletal ROS (+)   Abdominal   Peds negative pediatric ROS (+)  Hematology negative hematology ROS (+)   Anesthesia Other Findings   Reproductive/Obstetrics negative OB ROS                           Anesthesia Physical Anesthesia Plan  ASA: II and emergent  Anesthesia Plan: General   Post-op Pain Management:    Induction: Intravenous  Airway Management Planned: Oral ETT  Additional Equipment:   Intra-op Plan:   Post-operative Plan: Extubation in OR  Informed Consent: I have reviewed the patients History and Physical, chart, labs and discussed the procedure including the risks, benefits and alternatives for the proposed anesthesia with the patient or authorized representative who has indicated his/her understanding and acceptance.    Dental advisory given  Plan Discussed with: CRNA  Anesthesia Plan Comments:        Anesthesia Quick Evaluation

## 2013-05-23 NOTE — Care Management Note (Signed)
    Page 1 of 1   05/23/2013     4:15:32 PM   CARE MANAGEMENT NOTE 05/23/2013  Patient:  Kathy Kim, Kathy Kim   Account Number:  [id number]  Date Initiated:  05/23/2013  Documentation initiated by:  Lanier Clam  Subjective/Objective Assessment:   ADMITTED W/N/V.HX:CYSTOURETERSCOPY,DOUBLE J STENT PLACED/REMOVED.     Action/Plan:   FROM HOME W/SPOUSE.HAS PCP,PHARMACY.   Anticipated DC Date:  05/26/2013   Anticipated DC Plan:  HOME/SELF CARE      DC Planning Services  CM consult      Choice offered to / List presented to:             Status of service:  In process, will continue to follow Medicare Important Message given?   (If response is "NO", the following Medicare IM given date fields will be blank) Date Medicare IM given:   Date Additional Medicare IM given:    Discharge Disposition:    Per UR Regulation:  Reviewed for med. necessity/level of care/duration of stay  If discussed at Long Length of Stay Meetings, dates discussed:    Comments:  05/23/13 Arbor Cohen RN,BSN NCM [phone] NPO,CYSTOSCOPY.

## 2013-05-23 NOTE — Consult Note (Signed)
Subjective: Patient reports nausea and pain control good. Pt is status post cystoureterscopy, right holium laser stone extraction, and placement of double J stent 05/18/13 by Dr. Margarita Grizzle. States she was doing well and removed double J stent by tether as instructed 05/20/13. Developed nausea/vomiting 05/21/13 and this has now worsened. Now with left flank pain and came to ER for evaluation.  Objective: Vital signs in last 24 hours: Temp:  [97.8 F (36.6 C)-98 F (36.7 C)] 98 F (36.7 C) (06/03 1146) Pulse Rate:  [59-66] 59 (06/03 1146) Resp:  [16-18] 16 (06/03 1146) BP: (99-124)/(65) 99/65 mmHg (06/03 1146) SpO2:  [98 %-100 %] 98 % (06/03 1146)  Intake/Output from previous day:   Intake/Output this shift:    Physical Exam:  General:alert, cooperative and no distress GI: soft, non tender, normal bowel sounds, no palpable masses, no organomegaly, no inguinal hernia Female genitalia: not indicated Resp: clear to auscultation bilaterally Cardio: regular rate and rhythm, S1, S2 normal, no murmur, click, rub or gallop  Lab Results:  Recent Labs  05/23/13 0748  HGB 13.3  HCT 40.0   BMET  Recent Labs  05/23/13 0748  NA 139  K 3.7  CL 105  CO2 22  GLUCOSE 140*  BUN 20  CREATININE 0.96  CALCIUM 8.8    Recent Labs  05/23/13 0950  INR 1.09   No results found for this basename: LABURIN,  in the last 72 hours Results for orders placed during the hospital encounter of 04/28/09  URINE CULTURE     Status: None   Collection Time    04/28/09 10:41 PM      Result Value Range Status   Specimen Description URINE, CLEAN CATCH   Final   Special Requests NONE   Final   Colony Count 80,000 COLONIES/ML   Final   Culture ESCHERICHIA COLI   Final   Report Status 05/01/2009 FINAL   Final   Organism ID, Bacteria ESCHERICHIA COLI   Final    Studies/Results: Ct Abdomen Pelvis Wo Contrast  05/23/2013   *RADIOLOGY REPORT*  Clinical Data: Left flank pain.  Abdominal pain.  CT ABDOMEN  AND PELVIS WITHOUT CONTRAST  Technique:  Multidetector CT imaging of the abdomen and pelvis was performed following the standard protocol without intravenous contrast.  Comparison: 05/11/2013  Findings: There is new left hydronephrosis with a 3.7 mm stone obstructing the proximal left ureter.  The stone came from the left lower pole.  There are several other tiny left renal stones.  Right hydronephrosis has diminished since the prior study. However, there are two small stone fragments in the right ureteropelvic junction as well as multiple stone fragments in the lower pole of the right kidney.  No distal right ureteral stones.  There is a 9 mm area of lucency in the posterior aspect of the right lobe of the liver which may be artifactual.  Liver is otherwise normal as is the biliary tree.  The spleen, pancreas, adrenal glands are normal.  The bowel is normal except for a few diverticula in the distal colon.  Uterus and ovaries are normal.  No significant osseous abnormality.  IMPRESSION:  1.  3.7 mm stone obstructing the proximal left ureter at the L3 level. 2.  Decreased right hydronephrosis with at least two small stone fragments in the right ureteropelvic junction. 3.  Multiple small stone fragments in the lower pole of the right kidney and multiple tiny left ureteral calculi.   Original Report Authenticated By: Francene Boyers, M.D.  Dg Chest 2 View  05/23/2013   *RADIOLOGY REPORT*  Clinical Data: Preoperative chest x-ray  CHEST - 2 VIEW  Comparison: Prior chest x-ray 09/05/2007  Findings: Mild cardiomegaly with left ventricular and atrial prominence.  There is pulmonary vascular congestion without overt edema.  No focal airspace consolidation, pleural effusion or pneumothorax.  No acute osseous abnormality.  IMPRESSION:  1.  No acute cardiopulmonary disease 2.  Stable borderline cardiomegaly and pulmonary vascular congestion without overt edema   Original Report Authenticated By: Malachy Moan, M.D.     Assessment/Plan: Bilateral hydronephrosis. Right side has improved but still has at least 2 small stone fragments at right UPJ. Left 3.7 mm obstructing proximal left ureteral calculus. Patient will remain NPO at this time. Will proceed with cystoscopy, bilateral urteroscopy, bilateral retrograde pyelograms, bilateral stone extraction, and possible double J stent placement tonight by Dr. Patsi Sears. If pain and nausea/vomiting controlled post procedure will be discharged home.    LOS: 0 days   [facility] 05/23/2013, 1:56 PM

## 2013-05-23 NOTE — ED Notes (Signed)
AVW:[id number]<XB> Expected date:<BR> Expected time:<BR> Means of arrival:<BR> Comments:<BR> EMS 65F Left Flank Pain, hx of Kidney stones and had renal stents placed on Thurs

## 2013-05-23 NOTE — ED Notes (Signed)
Patient transported to CT 

## 2013-05-23 NOTE — Preoperative (Signed)
Beta Blockers   Reason not to administer Beta Blockers:Not Applicable, not on  Home BB 

## 2013-05-23 NOTE — Interval H&P Note (Signed)
History and Physical Interval Note:  05/23/2013 8:10 PM  Kathy Kim  has presented today for surgery, with the diagnosis of left ureteral stone/ right upj stone  The various methods of treatment have been discussed with the patient and family. After consideration of risks, benefits and other options for treatment, the patient has consented to  Procedure(s): CYSTOSCOPY WITH RETROGRADE PYELOGRAM/URETERAL STENT PLACEMENT (Bilateral) as a surgical intervention .  The patient's history has been reviewed, patient examined, no change in status, stable for surgery.  I have reviewed the patient's chart and labs.  Questions were answered to the patient's satisfaction.     Jethro Bolus I

## 2013-05-24 ENCOUNTER — Encounter (HOSPITAL_COMMUNITY): Payer: Self-pay | Admitting: Urology

## 2013-05-24 NOTE — Anesthesia Postprocedure Evaluation (Signed)
  Anesthesia Post-op Note  Patient: Kathy Kim  Procedure(s) Performed: Procedure(s) (LRB): CYSTOSCOPY WITH RETROGRADE PYELOGRAM/URETERAL STENT PLACEMENT (Bilateral)  Patient Location: PACU  Anesthesia Type: General  Level of Consciousness: awake and alert   Airway and Oxygen Therapy: Patient Spontanous Breathing  Post-op Pain: mild  Post-op Assessment: Post-op Vital signs reviewed, Patient's Cardiovascular Status Stable, Respiratory Function Stable, Patent Airway and No signs of Nausea or vomiting  Last Vitals:  Filed Vitals:   05/23/13 2242  BP: 128/79  Pulse: 60  Temp: 36.8 C  Resp: 18    Post-op Vital Signs: stable   Complications: No apparent anesthesia complications

## 2013-05-25 LAB — URINE CULTURE: Colony Count: 70000

## 2013-05-26 NOTE — Discharge Summary (Signed)
Physician Discharge Summary  Patient ID: Kathy Kim MRN: [id number] DOB/AGE: 03-22-1956 57 y.o.  Admit date: 05/23/2013 Discharge date: 05/26/2013  Admission Diagnoses: Ureterolithiasis [592.1] Renal colic on left side [788.0]  Discharge Diagnoses:  Left renal colic  Discharged Condition: improved  Hospital Course:  Surgery: Pre-operative diagnosis : Right UPJ stones x2 and 3.7 mm left proximal ureteral stone with hydronephrosis   Operation: Cystourethroscopy, bilateral retrograde PyeloGram with interpretation, insertion of bilateral Polaris stents, 5 [place] by 24 cm.  Consults: none  Significant Diagnostic Studies: No results found.  Treatments: none  Discharge Exam: Blood pressure 128/79, pulse 60, temperature 98.2 F (36.8 C), temperature source Oral, resp. rate 18, height 5\' 1"  (1.549 m), weight 70.943 kg (156 lb 6.4 oz), SpO2 96.00%. General appearance: alert and cooperative GI: normal findings: bowel sounds normal  Disposition: 01-Home or Self Care  Discharge Orders   Future Orders Complete By Expires     Discharge patient  As directed     Discontinue IV  As directed         Medication List    TAKE these medications       aspirin 81 MG tablet  Take 81 mg by mouth daily.     cephALEXin 500 MG capsule  Commonly known as:  KEFLEX  Take 1 capsule (500 mg total) by mouth 3 (three) times daily.     HYDROcodone-acetaminophen 5-325 MG per tablet  Commonly known as:  NORCO/VICODIN  Take 1 tablet by mouth every 6 (six) hours as needed for pain.     HYDROmorphone 4 MG tablet  Commonly known as:  DILAUDID  Take 1 tablet (4 mg total) by mouth every 4 (four) hours as needed for pain.     hyoscyamine 0.125 MG tablet  Commonly known as:  LEVSIN, ANASPAZ  Take 0.125 mg by mouth every 4 (four) hours as needed for cramping (bladder spasms).     oxybutynin 5 MG tablet  Commonly known as:  DITROPAN  Take 1 tablet (5 mg total) by mouth every 6 (six) hours as needed.      phenazopyridine 100 MG tablet  Commonly known as:  PYRIDIUM  Take 1 tablet (100 mg total) by mouth every 8 (eight) hours as needed for pain (Burning urination.  Will turn urine and body fluids orange.).     polyethylene glycol packet  Commonly known as:  MIRALAX / GLYCOLAX  Take 17 g by mouth daily.     promethazine 25 MG tablet  Commonly known as:  PHENERGAN  Take 25 mg by mouth every 6 (six) hours as needed for nausea.     senna-docusate 8.6-50 MG per tablet  Commonly known as:  SENOKOT S  Take 1 tablet by mouth 2 (two) times daily.     tamsulosin 0.4 MG Caps  Commonly known as:  FLOMAX  Take 1 capsule (0.4 mg total) by mouth daily.     traMADol-acetaminophen 37.5-325 MG per tablet  Commonly known as:  ULTRACET  Take 1 tablet by mouth every 6 (six) hours as needed for pain.     trimethoprim 100 MG tablet  Commonly known as:  TRIMPEX  Take 1 tablet (100 mg total) by mouth 1 day or 1 dose.           Follow-up Information   Follow up with Milford Cage, MD.   Contact information:   [id number] [place] [address] [place] [address] [phone]  SignedJethro Bolus I 05/26/2013, 9:18 AM

## 2013-05-29 ENCOUNTER — Other Ambulatory Visit: Payer: Self-pay | Admitting: Urology

## 2013-05-30 ENCOUNTER — Encounter (HOSPITAL_BASED_OUTPATIENT_CLINIC_OR_DEPARTMENT_OTHER): Payer: Self-pay | Admitting: *Deleted

## 2013-05-30 NOTE — Progress Notes (Signed)
NPO AFTER MN. ARRIVES AT 1130. NEEDS HG. WILL TAKE DILUADID AM OF SURG W/ SIPS OF WATER.

## 2013-05-31 ENCOUNTER — Encounter (HOSPITAL_BASED_OUTPATIENT_CLINIC_OR_DEPARTMENT_OTHER): Payer: Self-pay | Admitting: Anesthesiology

## 2013-05-31 ENCOUNTER — Ambulatory Visit (HOSPITAL_BASED_OUTPATIENT_CLINIC_OR_DEPARTMENT_OTHER)
Admission: RE | Admit: 2013-05-31 | Discharge: 2013-05-31 | Disposition: A | Discharge status: Home / Self Care | Payer: 59 | Source: Ambulatory Visit | Attending: Urology | Admitting: Urology

## 2013-05-31 ENCOUNTER — Ambulatory Visit (HOSPITAL_BASED_OUTPATIENT_CLINIC_OR_DEPARTMENT_OTHER): Payer: 59 | Admitting: Anesthesiology

## 2013-05-31 ENCOUNTER — Encounter (HOSPITAL_BASED_OUTPATIENT_CLINIC_OR_DEPARTMENT_OTHER)
Admission: RE | Disposition: A | Discharge status: Home / Self Care | Payer: Self-pay | Source: Ambulatory Visit | Attending: Urology

## 2013-05-31 DIAGNOSIS — N200 Calculus of kidney: Secondary | ICD-10-CM | POA: Insufficient documentation

## 2013-05-31 DIAGNOSIS — K5900 Constipation, unspecified: Secondary | ICD-10-CM | POA: Insufficient documentation

## 2013-05-31 DIAGNOSIS — K219 Gastro-esophageal reflux disease without esophagitis: Secondary | ICD-10-CM | POA: Insufficient documentation

## 2013-05-31 DIAGNOSIS — N3941 Urge incontinence: Secondary | ICD-10-CM | POA: Insufficient documentation

## 2013-05-31 DIAGNOSIS — N1330 Unspecified hydronephrosis: Secondary | ICD-10-CM | POA: Insufficient documentation

## 2013-05-31 DIAGNOSIS — N201 Calculus of ureter: Secondary | ICD-10-CM | POA: Insufficient documentation

## 2013-05-31 DIAGNOSIS — R110 Nausea: Secondary | ICD-10-CM | POA: Insufficient documentation

## 2013-05-31 HISTORY — PX: CYSTOSCOPY/RETROGRADE/URETEROSCOPY/STONE EXTRACTION WITH BASKET: SHX5317

## 2013-05-31 HISTORY — PX: CYSTOSCOPY W/ URETERAL STENT PLACEMENT: SHX1429

## 2013-05-31 HISTORY — DX: Urge incontinence: N39.41

## 2013-05-31 SURGERY — CYSTOSCOPY, WITH CALCULUS REMOVAL USING BASKET
Anesthesia: General | Site: Ureter | Wound class: Clean Contaminated

## 2013-05-31 MED ORDER — BELLADONNA ALKALOIDS-OPIUM 16.2-60 MG RE SUPP
RECTAL | Status: DC | PRN
  Administered 2013-05-31: 1 via RECTAL

## 2013-05-31 MED ORDER — FENTANYL CITRATE 0.05 MG/ML IJ SOLN
INTRAMUSCULAR | Status: DC | PRN
  Administered 2013-05-31: 50 ug via INTRAVENOUS
  Administered 2013-05-31 (×3): 25 ug via INTRAVENOUS

## 2013-05-31 MED ORDER — PROMETHAZINE HCL 25 MG PO TABS: 25.0000 mg | ORAL_TABLET | Freq: Four times a day (QID) | ORAL | Status: DC | PRN

## 2013-05-31 MED ORDER — DEXAMETHASONE SODIUM PHOSPHATE 4 MG/ML IJ SOLN
INTRAMUSCULAR | Status: DC | PRN
  Administered 2013-05-31: 10 mg via INTRAVENOUS

## 2013-05-31 MED ORDER — LACTATED RINGERS IV SOLN
INTRAVENOUS | Status: DC
  Administered 2013-05-31 (×2): via INTRAVENOUS
  Filled 2013-05-31: qty 1000

## 2013-05-31 MED ORDER — FENTANYL CITRATE 0.05 MG/ML IJ SOLN
25.0000 ug | INTRAMUSCULAR | Status: DC | PRN
  Filled 2013-05-31: qty 1

## 2013-05-31 MED ORDER — CEFAZOLIN SODIUM-DEXTROSE 2-3 GM-% IV SOLR
2.0000 g | INTRAVENOUS | Status: AC
  Administered 2013-05-31: 2 g via INTRAVENOUS
  Filled 2013-05-31: qty 50

## 2013-05-31 MED ORDER — PROMETHAZINE HCL 25 MG/ML IJ SOLN
6.2500 mg | INTRAMUSCULAR | Status: DC | PRN
  Filled 2013-05-31: qty 1

## 2013-05-31 MED ORDER — IOHEXOL 350 MG/ML SOLN
INTRAVENOUS | Status: DC | PRN
  Administered 2013-05-31: 20 mL via INTRAVENOUS

## 2013-05-31 MED ORDER — ACETAMINOPHEN 10 MG/ML IV SOLN
INTRAVENOUS | Status: DC | PRN
  Administered 2013-05-31: 1000 mg via INTRAVENOUS

## 2013-05-31 MED ORDER — SODIUM CHLORIDE 0.9 % IR SOLN
Status: DC | PRN
  Administered 2013-05-31: 6000 mL

## 2013-05-31 MED ORDER — MIDAZOLAM HCL 5 MG/5ML IJ SOLN
INTRAMUSCULAR | Status: DC | PRN
  Administered 2013-05-31: 2 mg via INTRAVENOUS

## 2013-05-31 MED ORDER — KETOROLAC TROMETHAMINE 30 MG/ML IJ SOLN
INTRAMUSCULAR | Status: DC | PRN
  Administered 2013-05-31: 30 mg via INTRAVENOUS

## 2013-05-31 MED ORDER — LIDOCAINE HCL 2 % EX GEL
CUTANEOUS | Status: DC | PRN
  Administered 2013-05-31: 1

## 2013-05-31 MED ORDER — PROPOFOL 10 MG/ML IV BOLUS
INTRAVENOUS | Status: DC | PRN
  Administered 2013-05-31: 200 mg via INTRAVENOUS

## 2013-05-31 MED ORDER — ONDANSETRON HCL 4 MG/2ML IJ SOLN
INTRAMUSCULAR | Status: DC | PRN
  Administered 2013-05-31: 4 mg via INTRAVENOUS

## 2013-05-31 MED ORDER — LIDOCAINE HCL (CARDIAC) 20 MG/ML IV SOLN
INTRAVENOUS | Status: DC | PRN
  Administered 2013-05-31: 100 mg via INTRAVENOUS

## 2013-05-31 SURGICAL SUPPLY — 31 items
ADAPTER CATH URET PLST 4-6FR (CATHETERS) ×4 IMPLANT
ADPR CATH URET STRL DISP 4-6FR (CATHETERS) ×1
BAG DRAIN URO-CYSTO SKYTR STRL (DRAIN) ×4 IMPLANT
BAG DRN UROCATH (DRAIN) ×1
BASKET ZERO TIP NITINOL 2.4FR (BASKET) ×4 IMPLANT
BRUSH URET BIOPSY 3F (UROLOGICAL SUPPLIES) IMPLANT
BSKT STON RTRVL ZERO TP 2.4FR (BASKET) ×3
CANISTER SUCT LVC 12 LTR MEDI- (MISCELLANEOUS) ×4 IMPLANT
CATH CLEAR GEL 3F BACKSTOP (CATHETERS) IMPLANT
CATH INTERMIT  6FR 70CM (CATHETERS) IMPLANT
CATH URET 5FR 28IN CONE TIP (BALLOONS)
CATH URET 5FR 28IN OPEN ENDED (CATHETERS) ×4 IMPLANT
CATH URET 5FR 70CM CONE TIP (BALLOONS) IMPLANT
CLOTH BEACON ORANGE TIMEOUT ST (SAFETY) ×4 IMPLANT
DRAPE CAMERA CLOSED 9X96 (DRAPES) ×4 IMPLANT
GLOVE BIO SURGEON STRL SZ 6.5 (GLOVE) ×4 IMPLANT
GLOVE BIO SURGEON STRL SZ7 (GLOVE) ×4 IMPLANT
GLOVE ECLIPSE 6.0 STRL STRAW (GLOVE) ×4 IMPLANT
GLOVE INDICATOR 7.5 STRL GRN (GLOVE) ×4 IMPLANT
GOWN PREVENTION PLUS LG XLONG (DISPOSABLE) ×8 IMPLANT
GOWN PREVENTION PLUS XLARGE (GOWN DISPOSABLE) ×4 IMPLANT
GOWN STRL NON-REIN LRG LVL3 (GOWN DISPOSABLE) ×8 IMPLANT
GUIDEWIRE 0.038 PTFE COATED (WIRE) IMPLANT
GUIDEWIRE ANG ZIPWIRE 038X150 (WIRE) IMPLANT
GUIDEWIRE STR DUAL SENSOR (WIRE) ×8 IMPLANT
IV NS IRRIG 3000ML ARTHROMATIC (IV SOLUTION) ×8 IMPLANT
LASER FIBER DISP (UROLOGICAL SUPPLIES) IMPLANT
PACK CYSTOSCOPY (CUSTOM PROCEDURE TRAY) ×4 IMPLANT
SHEATH ACCESS URETERAL 38CM (SHEATH) ×4 IMPLANT
STENT PERCUFLEX 4.8FRX24 (STENTS) ×8 IMPLANT
SYRINGE IRR TOOMEY STRL 70CC (SYRINGE) IMPLANT

## 2013-05-31 NOTE — Transfer of Care (Signed)
Immediate Anesthesia Transfer of Care Note  Patient: Kathy Kim  Procedure(s) Performed: Procedure(s) (LRB): CYSTOSCOPY/RETROGRADE/URETEROSCOPY/STONE EXTRACTION WITH BASKET (Bilateral) CYSTOSCOPY WITH STENT REPLACEMENT (Bilateral)  Patient Location: Patient transported to PACU with oxygen via face mask at 4 Liters / Min  Anesthesia Type: General  Level of Consciousness: awake and alert   Airway & Oxygen Therapy: Patient Spontanous Breathing and Patient connected to face mask oxygen  Post-op Assessment: Report given to PACU RN and Post -op Vital signs reviewed and stable  Post vital signs: Reviewed and stable  Dentition: Teeth and oropharynx remain in pre-op condition  Complications: No apparent anesthesia complications

## 2013-05-31 NOTE — Anesthesia Postprocedure Evaluation (Signed)
  Anesthesia Post-op Note  Patient: Kathy Kim  Procedure(s) Performed: Procedure(s) (LRB): CYSTOSCOPY/RETROGRADE/URETEROSCOPY/STONE EXTRACTION WITH BASKET (Bilateral) CYSTOSCOPY WITH STENT REPLACEMENT (Bilateral)  Patient Location: PACU  Anesthesia Type: General  Level of Consciousness: awake and alert   Airway and Oxygen Therapy: Patient Spontanous Breathing  Post-op Pain: mild  Post-op Assessment: Post-op Vital signs reviewed, Patient's Cardiovascular Status Stable, Respiratory Function Stable, Patent Airway and No signs of Nausea or vomiting  Last Vitals:  Filed Vitals:   05/31/13 1445  BP: 111/76  Pulse: 70  Temp:   Resp: 12    Post-op Vital Signs: stable   Complications: No apparent anesthesia complications

## 2013-05-31 NOTE — Anesthesia Preprocedure Evaluation (Signed)

## 2013-05-31 NOTE — Op Note (Signed)
Urology Operative Report  Date of Procedure: 05/31/13  Surgeon: Natalia Leatherwood, MD Assistant:  None  Preoperative Diagnosis: Bilateral ureter stones. Right nephrolithiasis. Postoperative Diagnosis:  Same  Procedure(s): Left ureteroscopy Right ureteroscopy Left ureter stone retrieval Right ureter stone retrieval Right renal stone retrieval Bilateral retrograde pyelograms Bilateral ureter stent removal Bilateral ureter stent placement (4.8 x 24)  Estimated blood loss: Minimal  Specimen: Stones provided to patient.  Drains: None  Complications: None  Findings: Bilateral proximal ureter edema. Bilateral proximal ureter stones. Right ureter stones. Negative extravasation of contrast on bilateral retrograde pyelograms. 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  History of present illness: 57 year old female presents today for bilateral ureteroscopy. She underwent ureteroscopy for a right proximal ureter stone in late May 2014. She presented back over the weekend with bilateral flank pain and had bilateral ureter stents placed due to bilateral proximal ureter stones. She was noted that time also have a right lower pole large renal stone. I could not visualize this previously during ureteroscopy, but it was approximately 1.3 semirigid size. She presents today for bilateral ureteroscopy.   Procedure in detail: After informed consent was obtained, the patient was taken to the operating room. They were placed in the supine position. SCDs were turned on and in place. IV antibiotics were infused, and general anesthesia was induced. A timeout was performed in which the correct patient, surgical site, and procedure were identified and agreed upon by the team.  The patient was placed in a dorsolithotomy position, making sure to pad all pertinent neurovascular pressure points. The genitals were prepped and draped in the usual sterile fashion.  A rigid cystoscope was passed through  the urethra and into the bladder. Attention was turned to the left ureter orifice. A stent grasper was used to pole the left ureter stent to the urethral meatus. A sensor tip wire was placed up the stent and a curl was noted in the left renal pelvis. The left ureter was removed. The sensor wire was secured to the drape as a safety wire. The left ureter orifice was cannulated with a 5 [place] ureter catheter and I injected contrast to obtain a retrograde pyelogram. I did not see any filling defects or hydronephrosis. Elected to proceed with ureteroscopy due to the small size of the stone in the proximal left ureter on CT scan. I placed a second sensor tip wire up the left ureter and into the left renal pelvis. I then placed a 12-14 ureter access sheath only into the distal ureter. The obturator and wire were then removed leaving the safety wire in place. During placement of the ureter scope the ureter access sheath was so shallow in the ureter that it fell out proximal access the ureter. Access sheath was then removed and I was able to navigate the ureteroscope which was a flexible digital scope through the ureter and into the proximal ureter where the stone was encountered. This was grasped with a 0 tip Nitinol basket and removed. I replaced the ureteroscope and the left ureter and explored the kidney. The entire kidney was visualized. There were multiple Randall's plaques but no stones docked to grasp. There was noted to be edema in the proximal left ureter. There was no mucosal disruption noted during the length of the entire ureter.   Attention was turned to the right ureter orifice. A stent grasper was used to pull the stent to the urethral meatus and a sensor wire was placed up this and the right renal pelvis.  The stent was removed and the wire was secured to the drape as a safety wire. The bladder was drained periodically with the sheath of the cystoscope. Flexible digital ureter scope was then navigated up  the right ureter and the proximal ureter there was noted to be a very small stone. This was grasped with a 0 tip Nitinol basket. There was noted a large amount of edema in the right proximal ureter, but no strictures. I was able to take pictures of this. Attention was then turned into the kidney and the entire inside right kidney was visualized. There was a very large stone in the right lower pole which was very difficult to grasp I was able to remove several large pieces, but this does not seem to be equivalent to the 13 mm size stone seen on CT scan. It took me at least twice as long to remove this left lower pole stone and a normal stone do to this location and the need for angulation of the ureter scope along with using the Nitinol basket to curl around bouts of the opposite wall in order to grasp the stone. Eventually, all stones that I could visualize were removed. I also removed several large clots that appeared to be old. The ureter scope was then withdrawn and there was noted to be no mucosal disruption throughout the right ureter. Because of the large amount of edema in the right proximal ureter I elected to leave a stent and a 4.8 x 24 double-J ureter stent was placed over the wire without strings attached.  Fluoroscopy revealed remaining contrast in the left renal pelvis. I felt that this was an indication to place a stent in the left side as this was probably retaining fluid due to the team as well. The sensor wire was placed in the left ureter and up into the left renal pelvis. A 4.8 x 24 double-J ureter stent was placed over this wire into the left renal pelvis and deployed with a good curl noted in the left renal pelvis and in the bladder.  The bladder was then drained. I injected 10 cc of lidocaine jelly into the urethra and in place a belladonna and opium suppository into her rectum. This completed the procedure. She was placed back in a supine position, anesthesia was reversed, and she was  taken to the PACU in stable condition.  All counts were correct at the end of the case.

## 2013-05-31 NOTE — Anesthesia Procedure Notes (Signed)
Procedure Name: LMA Insertion Date/Time: 05/31/2013 11:43 AM Performed by: Maris Berger T Pre-anesthesia Checklist: Patient identified, Emergency Drugs available, Suction available and Patient being monitored Patient Re-evaluated:Patient Re-evaluated prior to inductionOxygen Delivery Method: Circle System Utilized Preoxygenation: Pre-oxygenation with 100% oxygen Intubation Type: IV induction Ventilation: Mask ventilation without difficulty LMA: LMA with gastric port inserted LMA Size: 4.0 Number of attempts: 1 Placement Confirmation: positive ETCO2 Dental Injury: Teeth and Oropharynx as per pre-operative assessment

## 2013-06-01 ENCOUNTER — Encounter (HOSPITAL_BASED_OUTPATIENT_CLINIC_OR_DEPARTMENT_OTHER): Payer: Self-pay | Admitting: Urology

## 2013-06-02 NOTE — H&P (Signed)
Urology History and Physical Exam  CC: Bilateral ureter stones. Right nephrolithiasis.  HPI: 57 year old female presents for bilateral proximal ureter stones and right nephrolithiasis. She had a ureteroscopy on 05/18/13 for a right proximal ureter stone which was larger than 1 cm. A stent was left in place and the patient was to remove the stent on her with the tethering string. She did this but returned to the hospital 05/23/13 with continued pain in the right side and new pain in the left side. A CT scan showed a new stone in the left ureter which was obstructing and 2 small stone fragments in the right proximal ureter. She also has a known right lower pole stone which was 1.3 cm in size. Bilateral ureter stents were placed on 05/23/13. She presents today for bilateral ureteroscopy. The stone in the left proximal ureter was approximately 3.7 mm in size. The 2 small fragments in the right proximal ureter were 2 mm a piece. We have discussed management options and she is elected to proceed after discussing risks, benefits, alternatives, and likelihood of achieving goals. She has been on antibiotics while her stent has been placed. A urine culture is currently pending. She received IV antibiotics according to her previous sensitivities.  PMH: Past Medical History  Diagnosis Date  . GERD (gastroesophageal reflux disease)   . History of kidney stones   . Right ureteral stone   . Hydronephrosis, right   . Constipation   . Frequency of urination   . Hematuria   . Rash   . Urge urinary incontinence   . Renal calculi     right    PSH: Past Surgical History  Procedure Laterality Date  . Cystoscopy w/ ureteral stent placement Right 05/11/2013    Procedure: CYSTOSCOPY WITH RETROGRADE PYELOGRAM/URETERAL STENT PLACEMENT, BLADDER BIOPSY;  Surgeon: Milford Cage, MD;  Location: [facility];  Service: Urology;  Laterality: Right;  cysto, bil retrogrades, stent placement   . Right  ureteroscopic stone extraciton  03-23-2001  . Cardiac catheterization  09-05-2007  DR BERRY    NORMAL CORONARIES AND LVF  . Tubal ligation    . Tympanoplasty  AGE 20  . Abdominoplasty  JAN 2014  . Cystoscopy with retrograde pyelogram, ureteroscopy and stent placement Right 05/18/2013    Procedure: CYSTOSCOPY WITH RETROGRADE PYELOGRAM, URETEROSCOPY AND STENT PLACEMENT;  Surgeon: Milford Cage, MD;  Location: [facility];  Service: Urology;  Laterality: Right;  STENT EXCHANGE    . Holmium laser application Right 05/18/2013    Procedure: HOLMIUM LASER APPLICATION;  Surgeon: Milford Cage, MD;  Location: [facility];  Service: Urology;  Laterality: Right;  . Cystoscopy w/ ureteral stent placement Bilateral 05/23/2013    Procedure: CYSTOSCOPY WITH RETROGRADE PYELOGRAM/URETERAL STENT PLACEMENT;  Surgeon: Kathi Ludwig, MD;  Location: [facility];  Service: Urology;  Laterality: Bilateral;  . Cystoscopy/retrograde/ureteroscopy/stone extraction with basket Bilateral 05/31/2013    Procedure: CYSTOSCOPY/RETROGRADE/URETEROSCOPY/STONE EXTRACTION WITH BASKET;  Surgeon: Milford Cage, MD;  Location: [facility];  Service: Urology;  Laterality: Bilateral;  . Cystoscopy w/ ureteral stent placement Bilateral 05/31/2013    Procedure: CYSTOSCOPY WITH STENT REPLACEMENT;  Surgeon: Milford Cage, MD;  Location: [facility];  Service: Urology;  Laterality: Bilateral;    Allergies: Allergies  Allergen Reactions  . Tetracyclines & Related Hives  . Codeine Rash    Medications: No prescriptions prior to admission     Social History: History   Social  History  . Marital Status: Married    Spouse Name: N/A    Number of Children: N/A  . Years of Education: N/A   Occupational History  . Not on file.   Social History Main Topics  . Smoking status: Former Smoker -- 0.25 packs/day for 15 years    Types: Cigarettes     Quit date: 05/17/2007  . Smokeless tobacco: Never Used  . Alcohol Use: No  . Drug Use: No  . Sexually Active: Not on file   Other Topics Concern  . Not on file   Social History Narrative  . No narrative on file    Family History: Family History  Problem Relation Age of Onset  . Arthritis Mother   . Mental illness Mother   . Hypertension Mother   . Stroke Mother   . Diabetes Father   . Heart disease Father   . Hypertension Father   . Hypertension Brother   . Cancer Paternal Grandmother     Review of Systems: Positive: Nausea. Bilateral flank pain. Frequency. Negative: Chest pain, fever, or SOB.  A further 10 point review of systems was negative except what is listed in the HPI.  Physical Exam:  General: No acute distress.  Awake. Head:  Normocephalic.  Atraumatic. ENT:  EOMI.  Mucous membranes moist Neck:  Supple.  No lymphadenopathy. CV:  S1 present. S2 present. Regular rate. Pulmonary: Equal effort bilaterally.  Clear to auscultation bilaterally. Abdomen: Soft.  Non- tender to palpation. Skin:  Normal turgor.  No visible rash. Extremity: No gross deformity of bilateral upper extremities.  No gross deformity of    bilateral lower extremities. Neurologic: Alert. Appropriate mood.    Studies:  Recent Labs     05/31/13  1133  HGB  13.5    No results found for this basename: NA, K, CL, CO2, BUN, CREATININE, CALCIUM, MAGNESIUM, GFRNONAA, GFRAA,  in the last 72 hours   No results found for this basename: PT, INR, APTT,  in the last 72 hours   No components found with this basename: ABG,     Assessment:  Bilateral ureter stones. Right nephrolithiasis.  Plan: To OR for cystoscopy, bilateral ureter stent removal, bilateral ureteroscopy, laser lithotripsy, bilateral retrograde pyelograms, possible bilateral ureter stent placement.

## 2013-09-26 ENCOUNTER — Other Ambulatory Visit: Payer: Self-pay | Admitting: *Deleted

## 2013-09-26 MED ORDER — SIMVASTATIN 40 MG PO TABS: 40.0000 mg | ORAL_TABLET | Freq: Every evening | ORAL | Status: DC

## 2013-10-26 ENCOUNTER — Other Ambulatory Visit: Payer: Self-pay

## 2013-11-02 ENCOUNTER — Telehealth: Payer: Self-pay | Admitting: Cardiovascular Disease

## 2013-11-02 NOTE — Telephone Encounter (Signed)
Please call-been having numbness in her hands a lot.Wonder if it might be diabetic related? Would like to know what her last lab work results were.Please call asap-might need to go have some lab work.

## 2013-11-02 NOTE — Telephone Encounter (Signed)
Returned call and pt verified x 2.  Pt c/o intermittent hand and feet numbness for the past few days.  Denied CP or SOB w/ symptoms.  Asked what her lab results were from earlier this year b/c she didn't even think until now that it could be diabetes.  Informed labs at last OV (April 2014) were to check her liver and lipids.  Advised she contact PCP for evaluation and further testing.  Pt verbalized understanding and agreed w/ plan.

## 2014-08-22 ENCOUNTER — Other Ambulatory Visit: Payer: Self-pay | Admitting: Gynecology

## 2014-08-23 LAB — CYTOLOGY - PAP

## 2014-08-24 IMAGING — CR DG CHEST 2V
2 series · 2 of 2 positions shown · non-contrast
Comparison: Prior chest x-ray 09/05/2007

CLINICAL DATA: Preoperative chest x-ray

CHEST - 2 VIEW

[w chest lat]
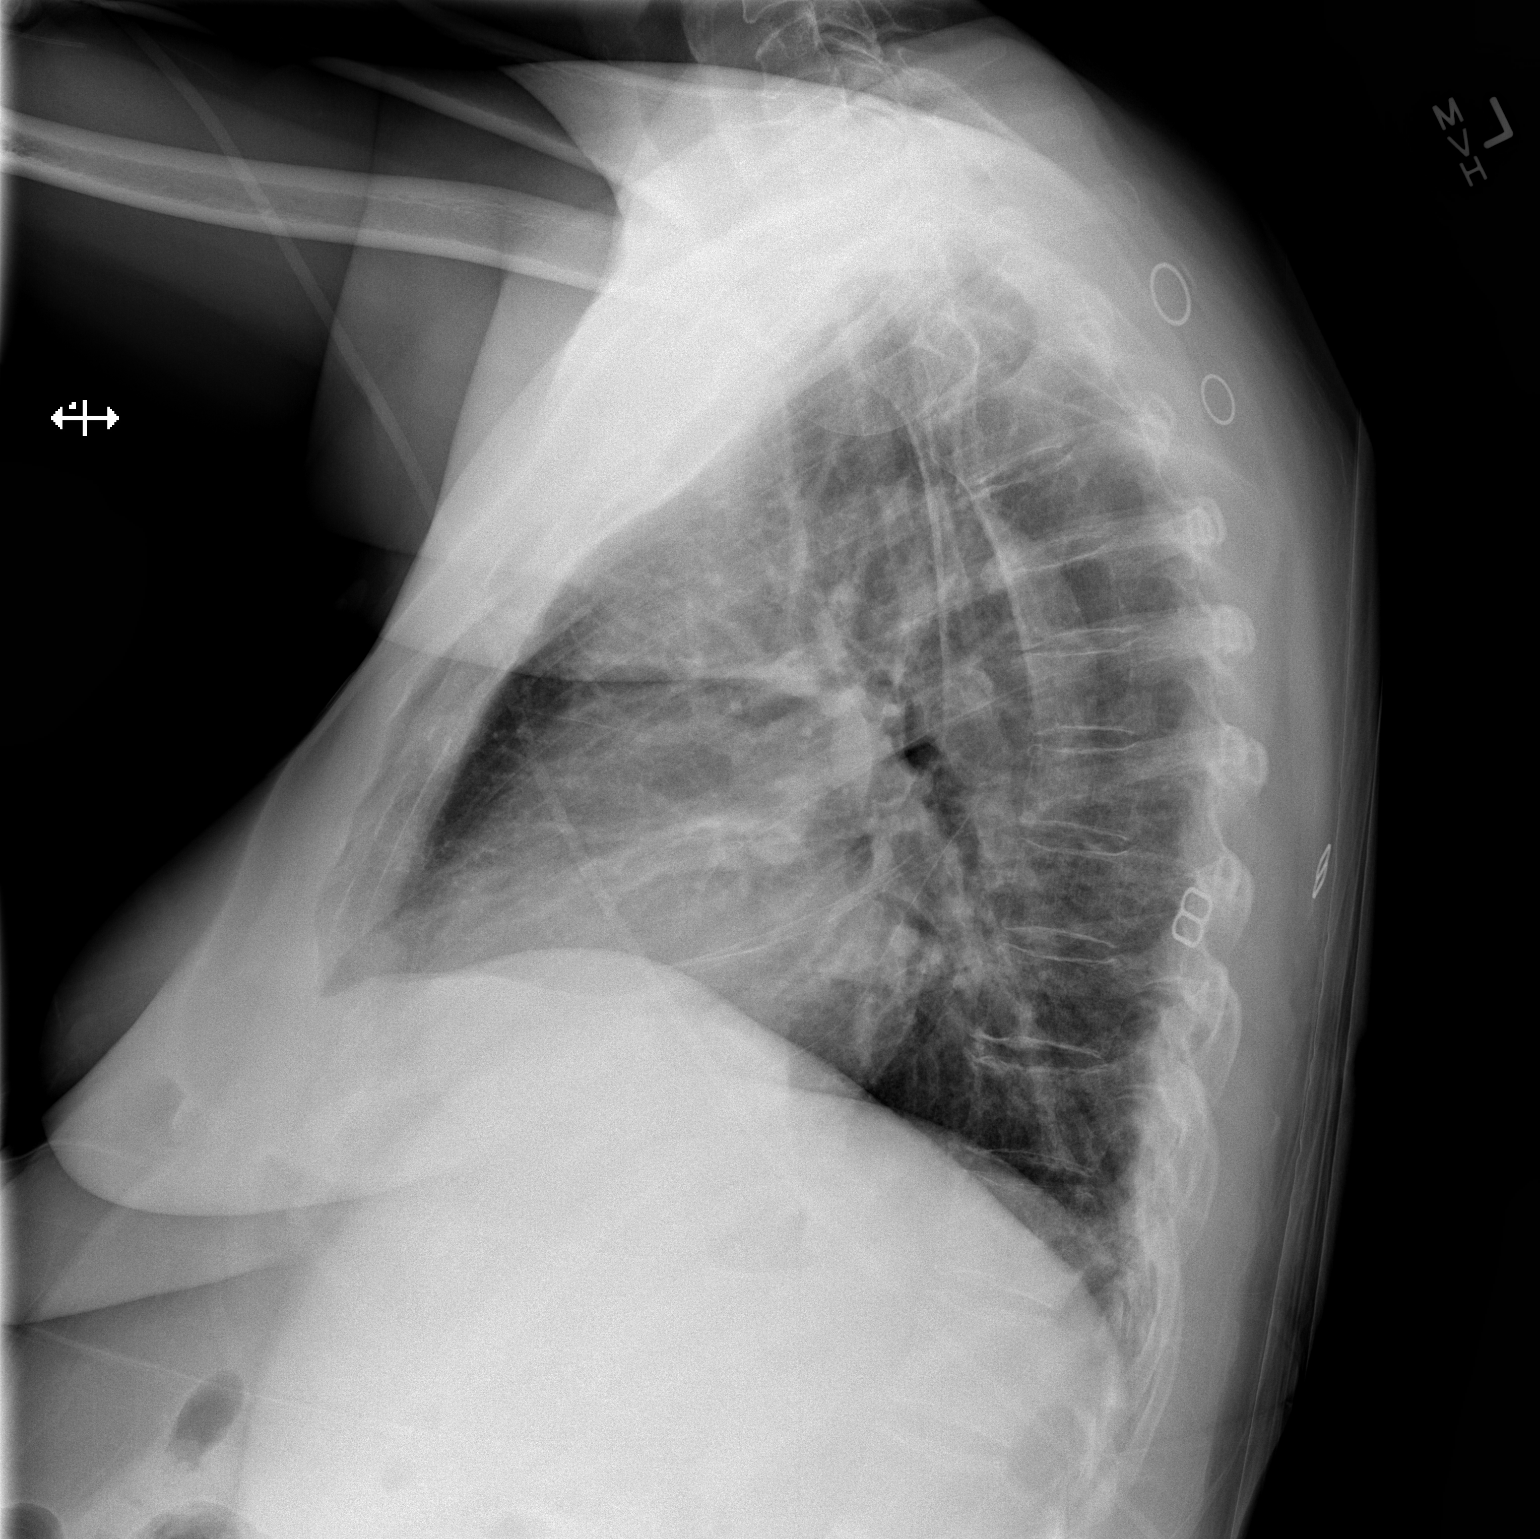

[x chest ap]
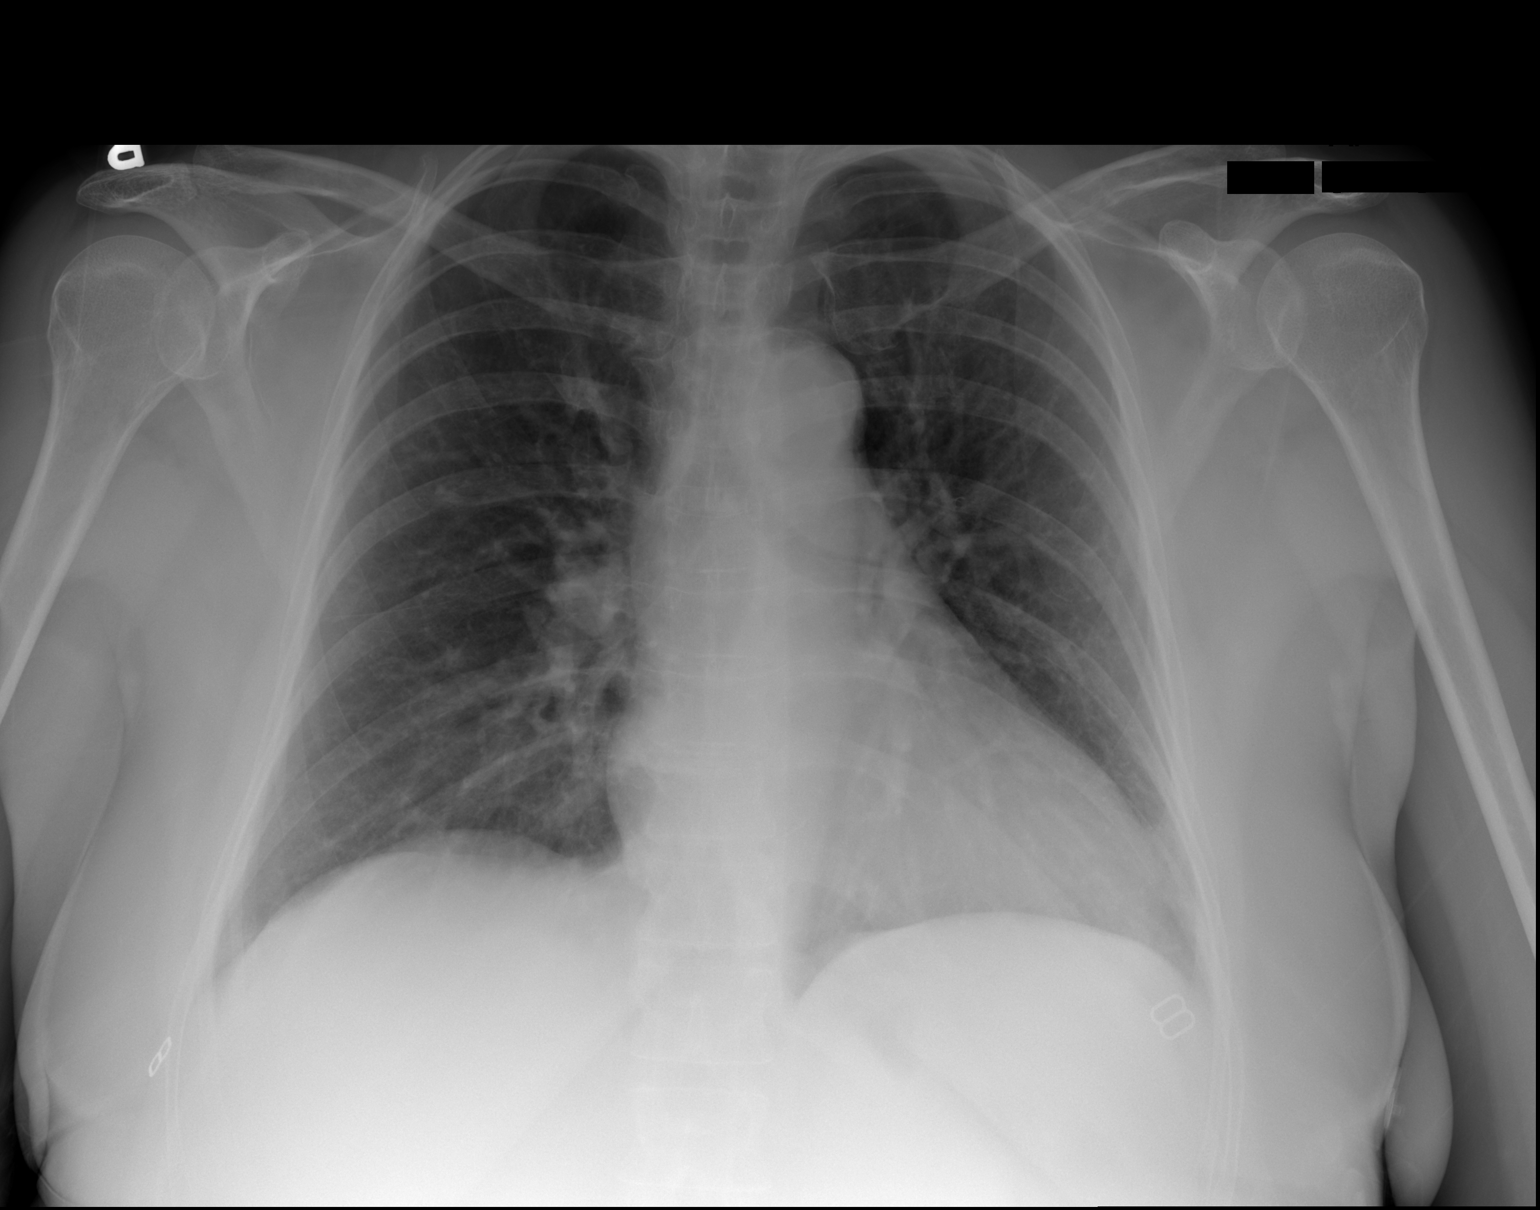

[2 of 2 positions shown; findings below may reference images not displayed]

FINDINGS: Mild cardiomegaly with left ventricular and atrial
prominence.  There is pulmonary vascular congestion without overt
edema.  No focal airspace consolidation, pleural effusion or
pneumothorax.  No acute osseous abnormality.
IMPRESSION: 1.  No acute cardiopulmonary disease
2.  Stable borderline cardiomegaly and pulmonary vascular
congestion without overt edema

## 2014-08-24 IMAGING — CT CT ABD-PELV W/O CM
1 series · 14 of 32 positions shown, 17 images · non-contrast
Comparison: 05/11/2013

CLINICAL DATA: Left flank pain.  Abdominal pain.

CT ABDOMEN AND PELVIS WITHOUT CONTRAST
TECHNIQUE: Multidetector CT imaging of the abdomen and pelvis was
performed following the standard protocol without intravenous
contrast.

[Series 6: sagittal · sagittal · 0.74mm/px · 14 of 128 slices shown, 17 images]
[im 5/128  lung]
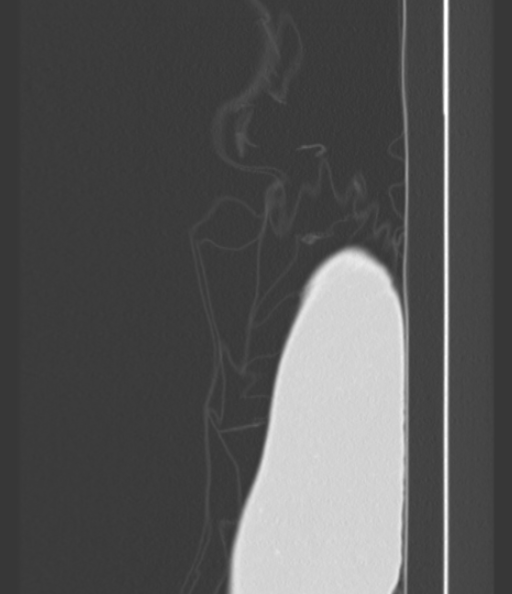
[im 9/128  soft-tissue]
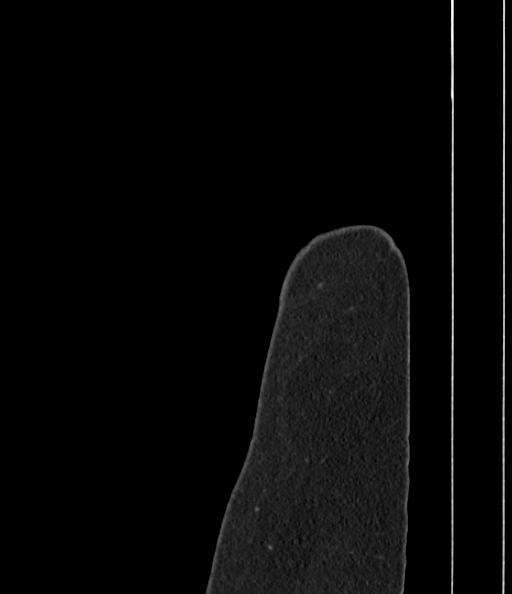
[im 9/128  lung]
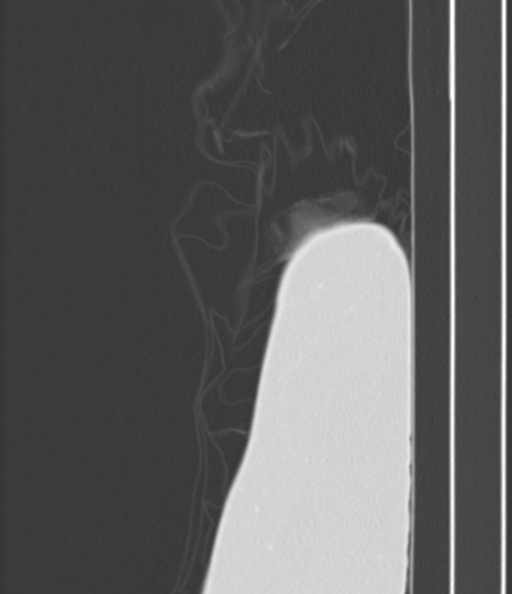
[im 9/128  bone]
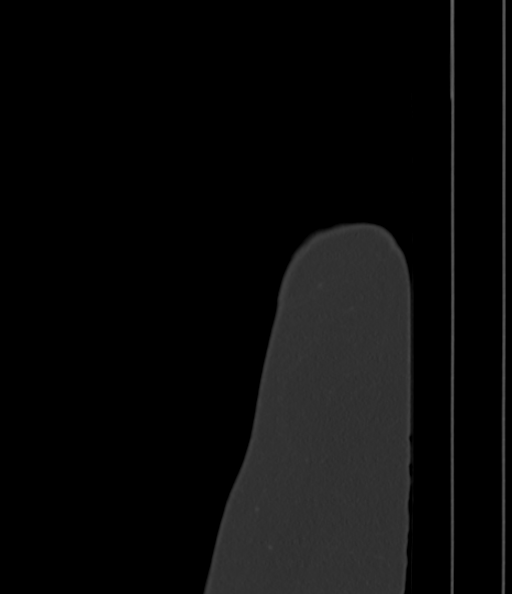
[im 13/128  lung]
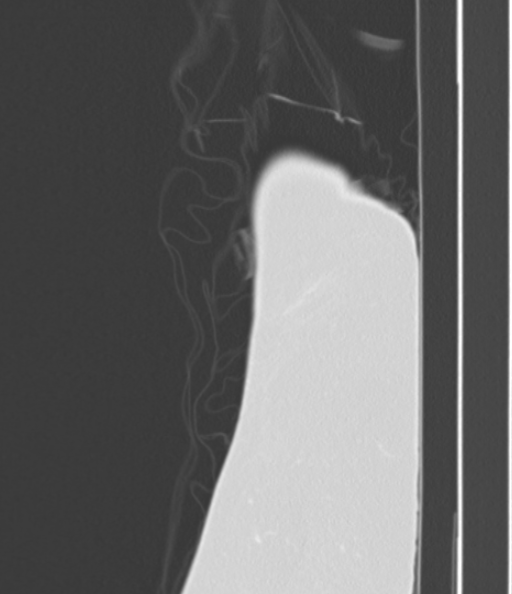
[im 17/128  lung]
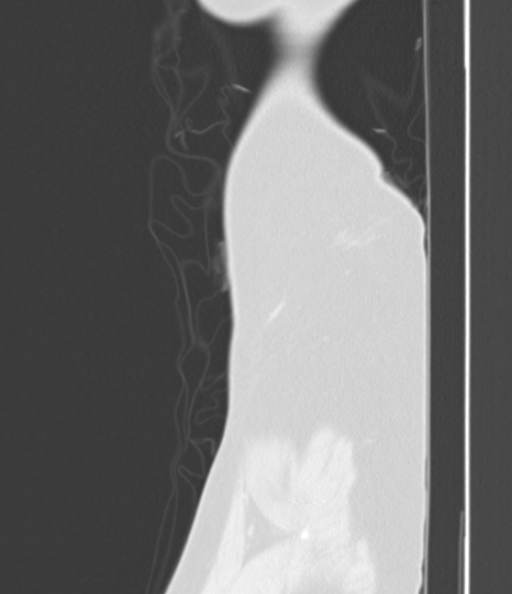
[im 21/128  soft-tissue]
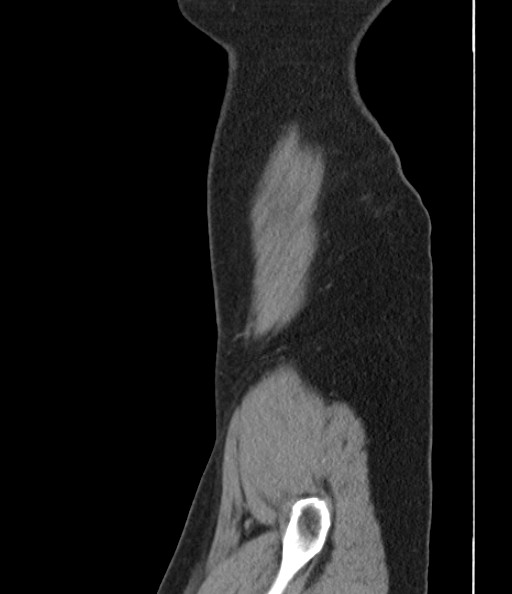
[im 29/128  soft-tissue]
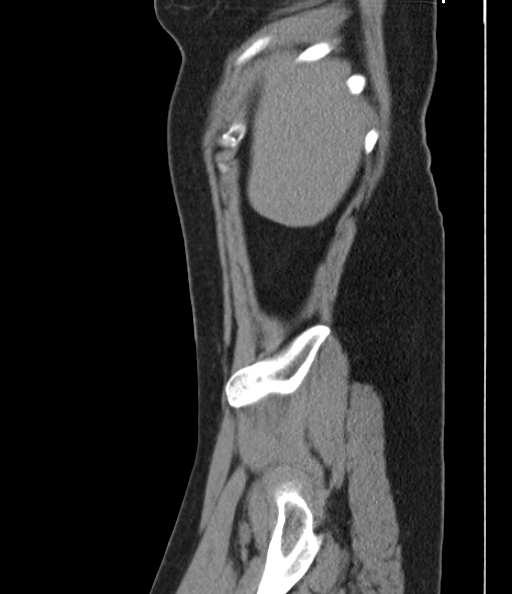
[im 41/128  soft-tissue]
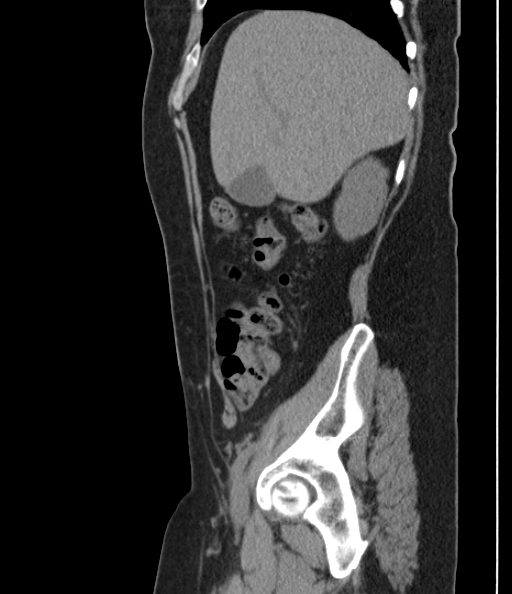
[im 54/128  soft-tissue]
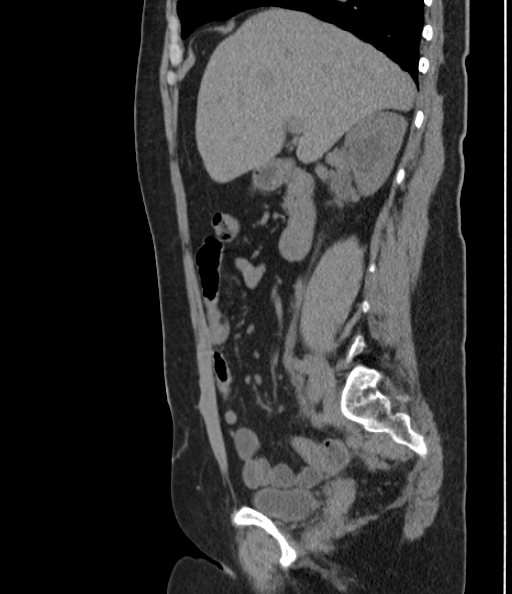
[im 66/128  soft-tissue]
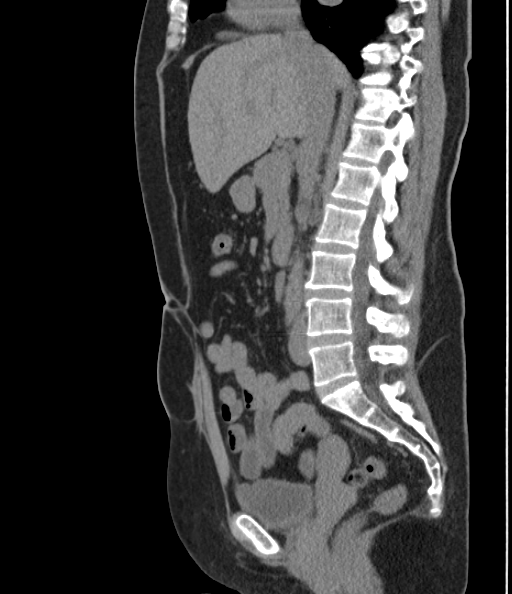
[im 74/128  soft-tissue]
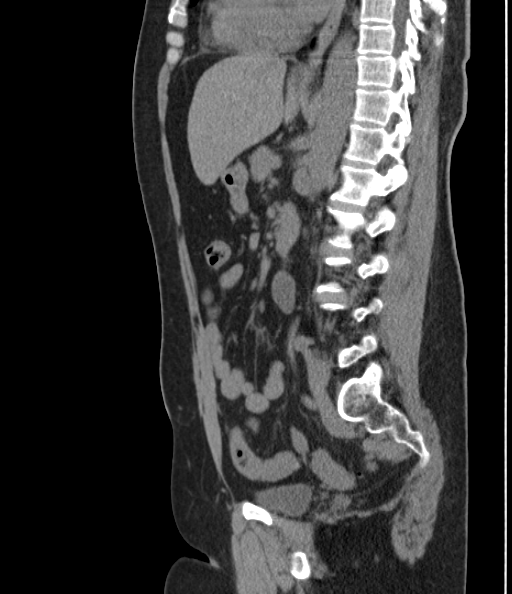
[im 87/128  soft-tissue]
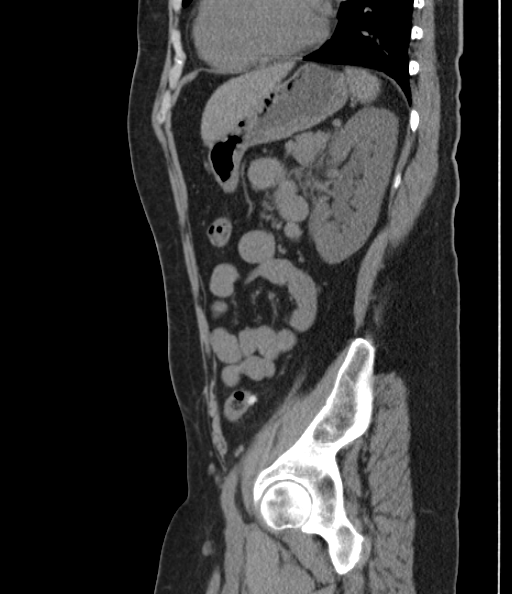
[im 99/128  soft-tissue]
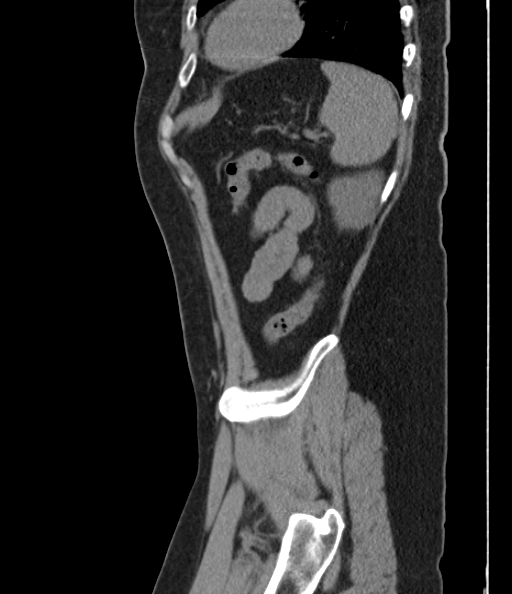
[im 99/128  bone]
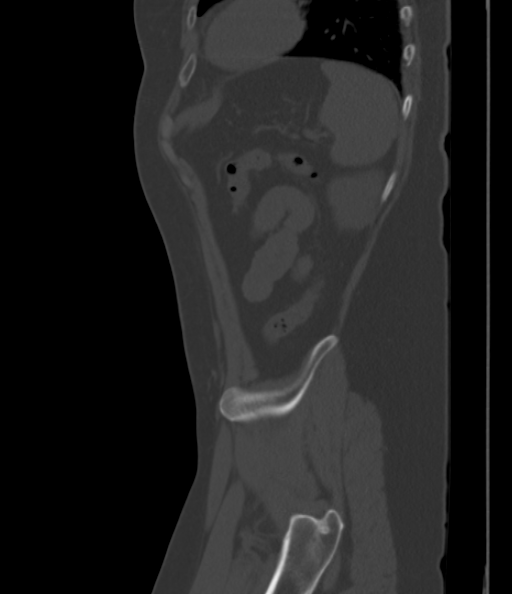
[im 107/128  soft-tissue]
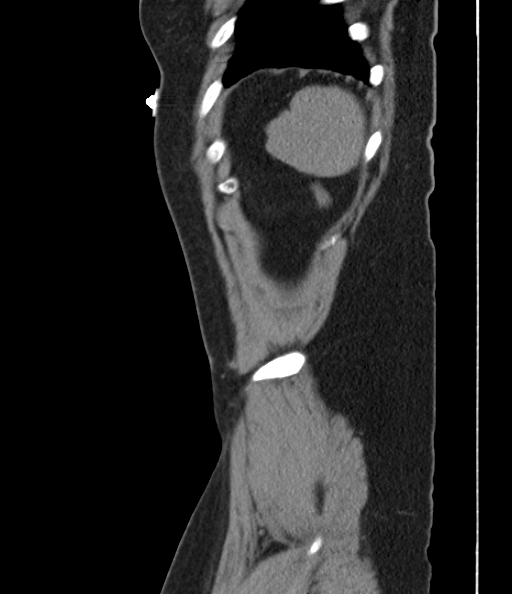
[im 119/128  soft-tissue]
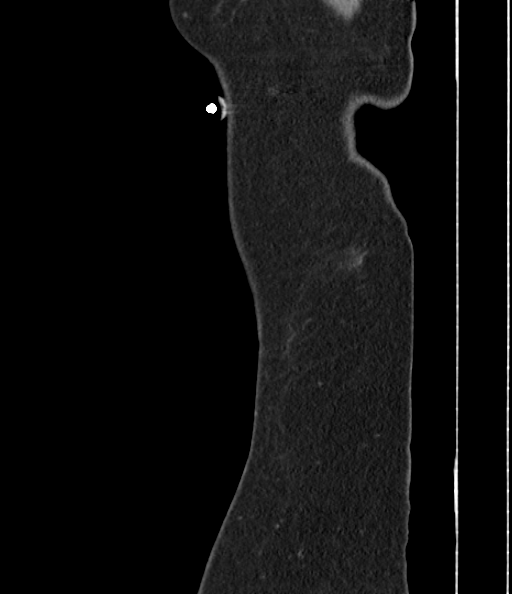

[14 of 32 positions shown; findings below may reference images not displayed]

FINDINGS: There is new left hydronephrosis with a 3.7 mm stone
obstructing the proximal left ureter.  The stone came from the left
lower pole.  There are several other tiny left renal stones.

Right hydronephrosis has diminished since the prior study.
However, there are two small stone fragments in the right
ureteropelvic junction as well as multiple stone fragments in the
lower pole of the right kidney.  No distal right ureteral stones.

There is a 9 mm area of lucency in the posterior aspect of the
right lobe of the liver which may be artifactual.  Liver is
otherwise normal as is the biliary tree.  The spleen, pancreas,
adrenal glands are normal.  The bowel is normal except for a few
diverticula in the distal colon.

Uterus and ovaries are normal.  No significant osseous abnormality.
IMPRESSION: 1.  3.7 mm stone obstructing the proximal left ureter at the L3
level.
2.  Decreased right hydronephrosis with at least two small stone
fragments in the right ureteropelvic junction.
3.  Multiple small stone fragments in the lower pole of the right
kidney and multiple tiny left ureteral calculi.

## 2015-06-27 ENCOUNTER — Encounter: Payer: Self-pay | Admitting: *Deleted

## 2015-07-23 ENCOUNTER — Encounter: Payer: Self-pay | Admitting: Cardiovascular Disease

## 2015-09-16 ENCOUNTER — Other Ambulatory Visit: Payer: Self-pay | Admitting: Gynecology

## 2015-09-17 LAB — CYTOLOGY - PAP

## 2015-12-24 ENCOUNTER — Institutional Professional Consult (permissible substitution): Payer: Self-pay | Admitting: Pulmonary Disease

## 2017-06-21 DIAGNOSIS — K5730 Diverticulosis of large intestine without perforation or abscess without bleeding: Secondary | ICD-10-CM

## 2017-06-21 HISTORY — DX: Diverticulosis of large intestine without perforation or abscess without bleeding: K57.30

## 2018-01-18 ENCOUNTER — Other Ambulatory Visit: Payer: Self-pay

## 2018-01-18 ENCOUNTER — Other Ambulatory Visit: Payer: Self-pay | Admitting: Urology

## 2018-01-18 ENCOUNTER — Encounter (HOSPITAL_BASED_OUTPATIENT_CLINIC_OR_DEPARTMENT_OTHER): Payer: Self-pay

## 2018-01-18 NOTE — Progress Notes (Signed)
Spoke with:  Lorel MonacoKelly "Paula" NPO:  No food after midnight/Clear liquids until 9:00AM DOS Arrival time:  1:00PM Labs: Istat 8 AM medications:  None Pre op orders: Needs second sign Ride home: Aletha Halimmy Phillips [phone] or Lebron QuamMelissa Rohme [phone] or Beatriz Stallionerria Prouse [phone]

## 2018-01-28 ENCOUNTER — Ambulatory Visit (HOSPITAL_BASED_OUTPATIENT_CLINIC_OR_DEPARTMENT_OTHER): Payer: BLUE CROSS/BLUE SHIELD | Admitting: Certified Registered"

## 2018-01-28 ENCOUNTER — Ambulatory Visit (HOSPITAL_BASED_OUTPATIENT_CLINIC_OR_DEPARTMENT_OTHER)
Admission: RE | Admit: 2018-01-28 | Discharge: 2018-01-28 | Disposition: A | Discharge status: Home / Self Care | Payer: BLUE CROSS/BLUE SHIELD | Source: Ambulatory Visit | Attending: Urology | Admitting: Urology

## 2018-01-28 ENCOUNTER — Encounter (HOSPITAL_BASED_OUTPATIENT_CLINIC_OR_DEPARTMENT_OTHER): Payer: Self-pay | Admitting: Certified Registered"

## 2018-01-28 ENCOUNTER — Encounter (HOSPITAL_BASED_OUTPATIENT_CLINIC_OR_DEPARTMENT_OTHER)
Admission: RE | Disposition: A | Discharge status: Home / Self Care | Payer: Self-pay | Source: Ambulatory Visit | Attending: Urology

## 2018-01-28 DIAGNOSIS — Z881 Allergy status to other antibiotic agents status: Secondary | ICD-10-CM | POA: Diagnosis not present

## 2018-01-28 DIAGNOSIS — Z885 Allergy status to narcotic agent status: Secondary | ICD-10-CM | POA: Diagnosis not present

## 2018-01-28 DIAGNOSIS — E785 Hyperlipidemia, unspecified: Secondary | ICD-10-CM | POA: Insufficient documentation

## 2018-01-28 DIAGNOSIS — N200 Calculus of kidney: Secondary | ICD-10-CM | POA: Insufficient documentation

## 2018-01-28 DIAGNOSIS — M1990 Unspecified osteoarthritis, unspecified site: Secondary | ICD-10-CM | POA: Insufficient documentation

## 2018-01-28 DIAGNOSIS — F329 Major depressive disorder, single episode, unspecified: Secondary | ICD-10-CM | POA: Insufficient documentation

## 2018-01-28 DIAGNOSIS — Z87891 Personal history of nicotine dependence: Secondary | ICD-10-CM | POA: Insufficient documentation

## 2018-01-28 DIAGNOSIS — G629 Polyneuropathy, unspecified: Secondary | ICD-10-CM | POA: Insufficient documentation

## 2018-01-28 DIAGNOSIS — R82994 Hypercalciuria: Secondary | ICD-10-CM | POA: Diagnosis not present

## 2018-01-28 DIAGNOSIS — K219 Gastro-esophageal reflux disease without esophagitis: Secondary | ICD-10-CM | POA: Insufficient documentation

## 2018-01-28 DIAGNOSIS — R011 Cardiac murmur, unspecified: Secondary | ICD-10-CM | POA: Insufficient documentation

## 2018-01-28 HISTORY — DX: Headache, unspecified: R51.9

## 2018-01-28 HISTORY — DX: Unspecified osteoarthritis, unspecified site: M19.90

## 2018-01-28 HISTORY — DX: Unilateral inguinal hernia, without obstruction or gangrene, not specified as recurrent: K40.90

## 2018-01-28 HISTORY — DX: Rosacea, unspecified: L71.9

## 2018-01-28 HISTORY — DX: Diverticulosis of large intestine without perforation or abscess without bleeding: K57.30

## 2018-01-28 HISTORY — PX: CYSTOSCOPY WITH RETROGRADE PYELOGRAM, URETEROSCOPY AND STENT PLACEMENT: SHX5789

## 2018-01-28 HISTORY — DX: Anesthesia of skin: R20.0

## 2018-01-28 HISTORY — DX: Major depressive disorder, single episode, unspecified: F32.9

## 2018-01-28 HISTORY — DX: Headache: R51

## 2018-01-28 HISTORY — DX: Depression, unspecified: F32.A

## 2018-01-28 HISTORY — PX: HOLMIUM LASER APPLICATION: SHX5852

## 2018-01-28 HISTORY — DX: Unspecified ovarian cyst, unspecified side: N83.209

## 2018-01-28 HISTORY — DX: Anesthesia of skin: R20.2

## 2018-01-28 HISTORY — DX: Hyperlipidemia, unspecified: E78.5

## 2018-01-28 HISTORY — DX: Radial styloid tenosynovitis (de quervain): M65.4

## 2018-01-28 HISTORY — DX: Cardiac murmur, unspecified: R01.1

## 2018-01-28 LAB — POCT I-STAT, CHEM 8
BUN: 17 mg/dL (ref 6–20)
CALCIUM ION: 1.16 mmol/L (ref 1.15–1.40)
Chloride: 106 mmol/L (ref 101–111)
Creatinine, Ser: 0.6 mg/dL (ref 0.44–1.00)
GLUCOSE: 93 mg/dL (ref 65–99)
HCT: 41 % (ref 36.0–46.0)
Hemoglobin: 13.9 g/dL (ref 12.0–15.0)
POTASSIUM: 3.9 mmol/L (ref 3.5–5.1)
SODIUM: 141 mmol/L (ref 135–145)
TCO2: 22 mmol/L (ref 22–32)

## 2018-01-28 SURGERY — CYSTOURETEROSCOPY, WITH RETROGRADE PYELOGRAM AND STENT INSERTION
Anesthesia: General | Site: Ureter | Laterality: Bilateral

## 2018-01-28 MED ORDER — PROPOFOL 10 MG/ML IV BOLUS
INTRAVENOUS | Status: DC | PRN
  Administered 2018-01-28: 160 mg via INTRAVENOUS

## 2018-01-28 MED ORDER — TRAMADOL HCL 50 MG PO TABS: 50.0000 mg | ORAL_TABLET | Freq: Four times a day (QID) | ORAL | 0 refills | Status: DC | PRN

## 2018-01-28 MED ORDER — LIDOCAINE 2% (20 MG/ML) 5 ML SYRINGE
INTRAMUSCULAR | Status: AC
  Filled 2018-01-28: qty 5

## 2018-01-28 MED ORDER — FENTANYL CITRATE (PF) 100 MCG/2ML IJ SOLN
INTRAMUSCULAR | Status: AC
  Filled 2018-01-28: qty 2

## 2018-01-28 MED ORDER — DEXAMETHASONE SODIUM PHOSPHATE 10 MG/ML IJ SOLN
INTRAMUSCULAR | Status: AC
  Filled 2018-01-28: qty 1

## 2018-01-28 MED ORDER — FENTANYL CITRATE (PF) 100 MCG/2ML IJ SOLN
INTRAMUSCULAR | Status: DC | PRN
  Administered 2018-01-28 (×2): 25 ug via INTRAVENOUS
  Administered 2018-01-28: 50 ug via INTRAVENOUS

## 2018-01-28 MED ORDER — DEXAMETHASONE SODIUM PHOSPHATE 10 MG/ML IJ SOLN
INTRAMUSCULAR | Status: DC | PRN
  Administered 2018-01-28: 10 mg via INTRAVENOUS

## 2018-01-28 MED ORDER — GENTAMICIN SULFATE 40 MG/ML IJ SOLN
5.0000 mg/kg | INTRAVENOUS | Status: AC
  Administered 2018-01-28: 360 mg via INTRAVENOUS
  Filled 2018-01-28: qty 9

## 2018-01-28 MED ORDER — ONDANSETRON HCL 4 MG/2ML IJ SOLN
INTRAMUSCULAR | Status: AC
  Filled 2018-01-28: qty 2

## 2018-01-28 MED ORDER — OXYCODONE HCL 5 MG PO TABS
5.0000 mg | ORAL_TABLET | Freq: Once | ORAL | Status: DC | PRN
  Filled 2018-01-28: qty 1

## 2018-01-28 MED ORDER — OXYCODONE HCL 5 MG/5ML PO SOLN
5.0000 mg | Freq: Once | ORAL | Status: DC | PRN
  Filled 2018-01-28: qty 5

## 2018-01-28 MED ORDER — KETOROLAC TROMETHAMINE 30 MG/ML IJ SOLN
INTRAMUSCULAR | Status: AC
  Filled 2018-01-28: qty 1

## 2018-01-28 MED ORDER — MIDAZOLAM HCL 2 MG/2ML IJ SOLN
INTRAMUSCULAR | Status: DC | PRN
  Administered 2018-01-28: 2 mg via INTRAVENOUS

## 2018-01-28 MED ORDER — MIDAZOLAM HCL 2 MG/2ML IJ SOLN
INTRAMUSCULAR | Status: AC
  Filled 2018-01-28: qty 2

## 2018-01-28 MED ORDER — LIDOCAINE 2% (20 MG/ML) 5 ML SYRINGE
INTRAMUSCULAR | Status: DC | PRN
  Administered 2018-01-28: 80 mg via INTRAVENOUS

## 2018-01-28 MED ORDER — FENTANYL CITRATE (PF) 100 MCG/2ML IJ SOLN
25.0000 ug | INTRAMUSCULAR | Status: DC | PRN
  Administered 2018-01-28 (×2): 50 ug via INTRAVENOUS
  Filled 2018-01-28: qty 1

## 2018-01-28 MED ORDER — IOHEXOL 300 MG/ML  SOLN
INTRAMUSCULAR | Status: DC | PRN
  Administered 2018-01-28: 20 mL via URETHRAL

## 2018-01-28 MED ORDER — KETOROLAC TROMETHAMINE 10 MG PO TABS: 10.0000 mg | ORAL_TABLET | Freq: Four times a day (QID) | ORAL | 0 refills | Status: DC | PRN

## 2018-01-28 MED ORDER — PROMETHAZINE HCL 25 MG/ML IJ SOLN
6.2500 mg | INTRAMUSCULAR | Status: DC | PRN
  Filled 2018-01-28: qty 1

## 2018-01-28 MED ORDER — ONDANSETRON HCL 4 MG/2ML IJ SOLN
4.0000 mg | Freq: Once | INTRAMUSCULAR | Status: AC
  Administered 2018-01-28: 4 mg via INTRAVENOUS
  Filled 2018-01-28: qty 2

## 2018-01-28 MED ORDER — LACTATED RINGERS IV SOLN
INTRAVENOUS | Status: DC
  Administered 2018-01-28 (×2): via INTRAVENOUS
  Filled 2018-01-28: qty 1000

## 2018-01-28 MED ORDER — TRAMADOL HCL 50 MG PO TABS
50.0000 mg | ORAL_TABLET | Freq: Four times a day (QID) | ORAL | Status: DC | PRN
  Administered 2018-01-28: 50 mg via ORAL
  Filled 2018-01-28: qty 1

## 2018-01-28 MED ORDER — DEXTROSE 5 % IV SOLN: 5.0000 mg/kg | INTRAVENOUS | Status: DC

## 2018-01-28 MED ORDER — ONDANSETRON HCL 4 MG/2ML IJ SOLN
INTRAMUSCULAR | Status: DC | PRN
  Administered 2018-01-28: 4 mg via INTRAVENOUS

## 2018-01-28 MED ORDER — KETOROLAC TROMETHAMINE 30 MG/ML IJ SOLN
INTRAMUSCULAR | Status: DC | PRN
  Administered 2018-01-28: 30 mg via INTRAVENOUS

## 2018-01-28 MED ORDER — TRAMADOL HCL 50 MG PO TABS
ORAL_TABLET | ORAL | Status: AC
  Filled 2018-01-28: qty 1

## 2018-01-28 MED FILL — KETOROLAC 10 MG TABLET: 10 | 5 days supply | Qty: 20 | Fill #0

## 2018-01-28 MED FILL — traMADol HCL 50 MG TABS: 50 | 3 days supply | Qty: 20 | Fill #0

## 2018-01-28 SURGICAL SUPPLY — 24 items
BAG DRAIN URO-CYSTO SKYTR STRL (DRAIN) ×3 IMPLANT
BASKET LASER NITINOL 1.9FR (BASKET) ×3 IMPLANT
BSKT STON RTRVL 120 1.9FR (BASKET) ×2
CATH INTERMIT  6FR 70CM (CATHETERS) ×3 IMPLANT
CLOTH BEACON ORANGE TIMEOUT ST (SAFETY) ×3 IMPLANT
FIBER LASER FLEXIVA 365 (UROLOGICAL SUPPLIES) IMPLANT
FIBER LASER TRAC TIP (UROLOGICAL SUPPLIES) ×3 IMPLANT
GLOVE BIO SURGEON STRL SZ7.5 (GLOVE) ×3 IMPLANT
GOWN STRL REUS W/TWL LRG LVL3 (GOWN DISPOSABLE) ×3 IMPLANT
GUIDEWIRE ANG ZIPWIRE 038X150 (WIRE) ×6 IMPLANT
GUIDEWIRE STR DUAL SENSOR (WIRE) ×6 IMPLANT
INFUSOR MANOMETER BAG 3000ML (MISCELLANEOUS) ×3 IMPLANT
IV NS 1000ML (IV SOLUTION) ×2
IV NS 1000ML BAXH (IV SOLUTION) ×2 IMPLANT
IV NS IRRIG 3000ML ARTHROMATIC (IV SOLUTION) ×3 IMPLANT
KIT RM TURNOVER CYSTO AR (KITS) ×3 IMPLANT
MANIFOLD NEPTUNE II (INSTRUMENTS) ×3 IMPLANT
NS IRRIG 500ML POUR BTL (IV SOLUTION) ×6 IMPLANT
PACK CYSTO (CUSTOM PROCEDURE TRAY) ×3 IMPLANT
SHEATH URETERAL 12FRX28CM (UROLOGICAL SUPPLIES) ×3 IMPLANT
STENT POLARIS 5FRX22 (STENTS) ×6 IMPLANT
SYRINGE 10CC LL (SYRINGE) ×3 IMPLANT
TUBE CONNECTING 12X1/4 (SUCTIONS) IMPLANT
TUBE FEEDING 8FR 16IN STR KANG (MISCELLANEOUS) ×3 IMPLANT

## 2018-01-28 NOTE — H&P (Signed)
Kathy Kim is an 62 y.o. female.    Chief Complaint: Pre-Op 1st Stage BILATERAL Ureteroscopic Stone Manipulation  HPI:   1 - Bilateral Recurrent Nephrolithiasis - Left 14mm upper, 1cm lower and Rt 1cm lower renal sontes by CT 12/2017 on eval gross hematuria. She has known hypercalciuria.  Today "Kathy Kim" is seen to proceed with bilateral first stage ureteroscopic stone manipulation for large volume renal stones in setting of hematuria. NO interval fevers. Most recent UCx negative.  Past Medical History:  Diagnosis Date  . Arthritis   . Cellulitis    Right leg  . Constipation   . De Quervain's disease (tenosynovitis)    Left  . Depression   . Frequency of urination   . GERD (gastroesophageal reflux disease)   . Headache   . Heart murmur   . Hematuria   . History of kidney stones   . Hydronephrosis, right   . Hyperlipidemia   . Inguinal hernia    Right  . Morbid obesity (HCC)   . Numbness and tingling in both hands   . Ovarian cyst   . Rash   . Renal calculi    right  . Right ureteral stone   . Rosacea   . Sigmoid diverticulosis 06/21/2017   [facility]  . Urge urinary incontinence   . UTI (urinary tract infection)     Past Surgical History:  Procedure Laterality Date  . ABDOMINOPLASTY  JAN 2014  . CARDIAC CATHETERIZATION  09-05-2007  DR BERRY   NORMAL CORONARIES AND LVF  . COLONOSCOPY    . CYSTOSCOPY W/ URETERAL STENT PLACEMENT Right 05/11/2013   Procedure: CYSTOSCOPY WITH RETROGRADE PYELOGRAM/URETERAL STENT PLACEMENT, BLADDER BIOPSY;  Surgeon: Milford Cage, MD;  Location: [facility];  Service: Urology;  Laterality: Right;  cysto, bil retrogrades, stent placement   . CYSTOSCOPY W/ URETERAL STENT PLACEMENT Bilateral 05/23/2013   Procedure: CYSTOSCOPY WITH RETROGRADE PYELOGRAM/URETERAL STENT PLACEMENT;  Surgeon: Kathi Ludwig, MD;  Location: [facility];  Service: Urology;  Laterality: Bilateral;  . CYSTOSCOPY W/  URETERAL STENT PLACEMENT Bilateral 05/31/2013   Procedure: CYSTOSCOPY WITH STENT REPLACEMENT;  Surgeon: Milford Cage, MD;  Location: [facility];  Service: Urology;  Laterality: Bilateral;  . CYSTOSCOPY WITH RETROGRADE PYELOGRAM, URETEROSCOPY AND STENT PLACEMENT Right 05/18/2013   Procedure: CYSTOSCOPY WITH RETROGRADE PYELOGRAM, URETEROSCOPY AND STENT PLACEMENT;  Surgeon: Milford Cage, MD;  Location: [facility];  Service: Urology;  Laterality: Right;  STENT EXCHANGE   . CYSTOSCOPY/RETROGRADE/URETEROSCOPY/STONE EXTRACTION WITH BASKET Bilateral 05/31/2013   Procedure: CYSTOSCOPY/RETROGRADE/URETEROSCOPY/STONE EXTRACTION WITH BASKET;  Surgeon: Milford Cage, MD;  Location: [facility];  Service: Urology;  Laterality: Bilateral;  . EXCISIONAL HEMORRHOIDECTOMY    . HOLMIUM LASER APPLICATION Right 05/18/2013   Procedure: HOLMIUM LASER APPLICATION;  Surgeon: Milford Cage, MD;  Location: [facility];  Service: Urology;  Laterality: Right;  . KIDNEY STONE SURGERY    . NM MYOCAR PERF WALL MOTION  07/02/2005   negative  . RIGHT URETEROSCOPIC STONE EXTRACITON  03-23-2001  . TONSILLECTOMY    . TUBAL LIGATION    . TYMPANOPLASTY  AGE 36    Family History  Problem Relation Age of Onset  . Arthritis Mother   . Mental illness Mother   . Hypertension Mother   . Stroke Mother   . Diabetes Father   . Heart disease Father   . Hypertension Father   . Cancer Paternal Grandmother   .  Hypertension Brother    Social History:  reports that she quit smoking about 10 years ago. Her smoking use included cigarettes. She has a 3.75 pack-year smoking history. she has never used smokeless tobacco. She reports that she drinks alcohol. She reports that she does not use drugs.  Allergies:  Allergies  Allergen Reactions  . Tetracyclines & Related Hives  . Codeine Rash    No medications prior to admission.    No results found  for this or any previous visit (from the past 48 hour(s)). No results found.  Review of Systems  Constitutional: Negative.  Negative for chills and fever.  HENT: Negative.   Eyes: Negative.   Respiratory: Negative.   Cardiovascular: Negative.   Gastrointestinal: Negative.   Genitourinary: Positive for hematuria. Negative for dysuria.  Musculoskeletal: Negative.   Skin: Negative.   Neurological: Negative.   Endo/Heme/Allergies: Negative.   Psychiatric/Behavioral: Negative.     Height 5\' 2"  (1.575 m), weight 69.9 kg (154 lb). Physical Exam  Constitutional: She appears well-developed.  HENT:  Head: Normocephalic.  Eyes: Pupils are equal, round, and reactive to light.  Neck: Normal range of motion.  Cardiovascular: Normal rate.  Respiratory: Effort normal.  GI: Soft.  Genitourinary:  Genitourinary Comments: NO CVAT.   Musculoskeletal: Normal range of motion.  Neurological: She is alert.  Skin: Skin is warm.  Psychiatric: She has a normal mood and affect.     Assessment/Plan  Proceed as planned with 1st stage BILATERAL ureteroscopic stone manipulation. Risks, benefits, alternative, expected peri-op course including need for bilateral stents discussed previously and reiterated today.   Sebastian AcheMANNY, Inetha Maret, MD 01/28/2018, 9:52 AM

## 2018-01-28 NOTE — Transfer of Care (Signed)
Immediate Anesthesia Transfer of Care Note  Patient: Quitman LivingsKelly P Jim  Procedure(s) Performed: Procedure(s) (LRB): CYSTOSCOPY WITH RETROGRADE PYELOGRAM, URETEROSCOPY AND STENT PLACEMENT, STONE BASKETRY (Bilateral) HOLMIUM LASER APPLICATION (Bilateral)  Patient Location: PACU  Anesthesia Type: General  Level of Consciousness: awake, oriented, sedated and patient cooperative  Airway & Oxygen Therapy: Patient Spontanous Breathing and Patient connected to face mask oxygen  Post-op Assessment: Report given to PACU RN and Post -op Vital signs reviewed and stable  Post vital signs: Reviewed and stable  Complications: No apparent anesthesia complications Last Vitals:  Vitals:   01/28/18 1516 01/28/18 1530  BP: (!) 123/96 112/79  Pulse: 65 (!) 54  Resp: 18 15  Temp: 36.6 C   SpO2: 95% 96%    Last Pain:  Vitals:   01/28/18 1320  TempSrc:   PainSc: 2       Patients Stated Pain Goal: 2 (01/28/18 1320)

## 2018-01-28 NOTE — Anesthesia Procedure Notes (Signed)
Procedure Name: LMA Insertion Date/Time: 01/28/2018 2:26 PM Performed by: Francie MassingHazel, Shirleyann Montero D, CRNA Pre-anesthesia Checklist: Patient identified, Emergency Drugs available, Suction available and Patient being monitored Patient Re-evaluated:Patient Re-evaluated prior to induction Oxygen Delivery Method: Circle system utilized Preoxygenation: Pre-oxygenation with 100% oxygen Induction Type: IV induction Ventilation: Mask ventilation without difficulty LMA: LMA inserted LMA Size: 4.0 Number of attempts: 1 Airway Equipment and Method: Bite block Placement Confirmation: positive ETCO2 Tube secured with: Tape Dental Injury: Teeth and Oropharynx as per pre-operative assessment

## 2018-01-28 NOTE — Discharge Instructions (Signed)
1 - You may have urinary urgency (bladder spasms) and bloody urine on / off with stent in place. This is normal. ? ?2 - Call MD or go to ER for fever >102, severe pain / nausea / vomiting not relieved by medications, or acute change in medical status ? ? ?[facility] ?[phone] ?Post Ureteroscopy With or Without Stent Instructions ? ?Definitions: ? ?Ureter: The duct that transports urine from the kidney to the bladder. ?Stent:   A plastic hollow tube that is placed into the ureter, from the kidney to the bladder to prevent the ureter from swelling shut. ? ?GENERAL INSTRUCTIONS: ? ?Despite the fact that no skin incisions were used, the area around the ureter and bladder is raw and irritated. The stent is a foreign body which will further irritate the bladder wall. This irritation is manifested by increased frequency of urination, both day and night, and by an increase in the urge to urinate. In some, the urge to urinate is present almost always. Sometimes the urge is strong enough that you may not be able to stop yourself from urinating. The only real cure is to remove the stent and then give time for the bladder wall to heal which can't be done until the danger of the ureter swelling shut has passed, which varies. ? ?You may see some blood in your urine while the stent is in place and a few days afterwards. Do not be alarmed, even if the urine was clear for a while. Get off your feet and drink lots of fluids until clearing occurs. If you start to pass clots or don't improve, call us. ? ?DIET: ?You may return to your normal diet immediately. Because of the raw surface of your bladder, alcohol, spicy foods, acid type foods and drinks with caffeine may cause irritation or frequency and should be used in moderation. To keep your urine flowing freely and to avoid constipation, drink plenty of fluids during the day ( 8-10 glasses ). ?Tip: Avoid cranberry juice because it is very  acidic. ? ?ACTIVITY: ?Your physical activity doesn't need to be restricted. However, if you are very active, you may see some blood in your urine. We suggest that you reduce your activity under these circumstances until the bleeding has stopped. ? ?BOWELS: ?It is important to keep your bowels regular during the postoperative period. Straining with bowel movements can cause bleeding. A bowel movement every other day is reasonable. Use a mild laxative if needed, such as Milk of Magnesia 2-3 tablespoons, or 2 Dulcolax tablets. Call if you continue to have problems. If you have been taking narcotics for pain, before, during or after your surgery, you may be constipated. Take a laxative if necessary. ? ? ?MEDICATION: ?You should resume your pre-surgery medications unless told not to. In addition you will often be given an antibiotic to prevent infection. These should be taken as prescribed until the bottles are finished unless you are having an unusual reaction to one of the drugs. ? ?PROBLEMS YOU SHOULD REPORT TO US: ?Fevers over 100.5 Fahrenheit. ?Heavy bleeding, or clots ( See above notes about blood in urine ). ?Inability to urinate. ?Drug reactions ( hives, rash, nausea, vomiting, diarrhea ). ?Severe burning or pain with urination that is not improving. ? ?FOLLOW-UP: ?You will need a follow-up appointment to monitor your progress. Call for this appointment at the number listed above. Usually the first appointment will be about three to fourteen days after your surgery. ? ? ? ?  ?  Post Anesthesia Home Care Instructions ? ?Activity: ?Get plenty of rest for the remainder of the day. A responsible individual must stay with you for 24 hours following the procedure.  ?For the next 24 hours, DO NOT: ?-Drive a car ?-Operate machinery ?-Drink alcoholic beverages ?-Take any medication unless instructed by your physician ?-Make any legal decisions or sign important papers. ? ?Meals: ?Start with liquid foods such as gelatin or  soup. Progress to regular foods as tolerated. Avoid greasy, spicy, heavy foods. If nausea and/or vomiting occur, drink only clear liquids until the nausea and/or vomiting subsides. Call your physician if vomiting continues. ? ?Special Instructions/Symptoms: ?Your throat may feel dry or sore from the anesthesia or the breathing tube placed in your throat during surgery. If this causes discomfort, gargle with warm salt water. The discomfort should disappear within 24 hours. ? ? ?    ?

## 2018-01-28 NOTE — Anesthesia Preprocedure Evaluation (Addendum)
Anesthesia Evaluation  Patient identified by MRN, date of birth, ID band Patient awake    Reviewed: Allergy & Precautions, H&P , NPO status , Patient's Chart, lab work & pertinent test results  Airway Mallampati: II  TM Distance: >3 FB Neck ROM: Full    Dental no notable dental hx. (+) Dental Advisory Given, Teeth Intact   Pulmonary former smoker,    breath sounds clear to auscultation       Cardiovascular negative cardio ROS   Rhythm:Regular Rate:Normal     Neuro/Psych Depression Peripheral neuropathy b/l hands    GI/Hepatic Neg liver ROS, GERD  Controlled,  Endo/Other  negative endocrine ROS  Renal/GU Nephrolithiaisis     Musculoskeletal  (+) Arthritis ,   Abdominal   Peds  Hematology negative hematology ROS (+)   Anesthesia Other Findings   Reproductive/Obstetrics                            Anesthesia Physical  Anesthesia Plan  ASA: II  Anesthesia Plan: General   Post-op Pain Management:    Induction: Intravenous  PONV Risk Score and Plan: 4 or greater and Ondansetron, Dexamethasone, Midazolam and Treatment may vary due to age or medical condition  Airway Management Planned: LMA  Additional Equipment: None  Intra-op Plan:   Post-operative Plan: Extubation in OR  Informed Consent: I have reviewed the patients History and Physical, chart, labs and discussed the procedure including the risks, benefits and alternatives for the proposed anesthesia with the patient or authorized representative who has indicated his/her understanding and acceptance.   Dental advisory given  Plan Discussed with: CRNA  Anesthesia Plan Comments:         Anesthesia Quick Evaluation

## 2018-01-28 NOTE — Brief Op Note (Signed)
01/28/2018  3:05 PM  PATIENT:  Kathy Kim  62 y.o. female  PRE-OPERATIVE DIAGNOSIS:  BILATERAL RENAL STONES  POST-OPERATIVE DIAGNOSIS:  BILATERAL RENAL STONES  PROCEDURE:  Procedure(s): CYSTOSCOPY WITH RETROGRADE PYELOGRAM, URETEROSCOPY AND STENT PLACEMENT, STONE BASKETRY (Bilateral) HOLMIUM LASER APPLICATION (Bilateral)  SURGEON:  Surgeon(s) and Role:    Sebastian Ache* Lyris Hitchman, MD - Primary  PHYSICIAN ASSISTANT:   ASSISTANTS: none   ANESTHESIA:   general  EBL:  minimal  BLOOD ADMINISTERED:none  DRAINS: none   LOCAL MEDICATIONS USED:  NONE  SPECIMEN:  Source of Specimen:  bilateral renal stone fragments  DISPOSITION OF SPECIMEN:  [facility] for compositional analysis  COUNTS:  YES  TOURNIQUET:  * No tourniquets in log *  DICTATION: .Other Dictation: Dictation Number [id number]  PLAN OF CARE: Discharge to home after PACU  PATIENT DISPOSITION:  PACU - hemodynamically stable.   Delay start of Pharmacological VTE agent (>24hrs) due to surgical blood loss or risk of bleeding: yes

## 2018-01-30 NOTE — Anesthesia Postprocedure Evaluation (Signed)
Anesthesia Post Note  Patient: Kathy Kim  Procedure(s) Performed: CYSTOSCOPY WITH RETROGRADE PYELOGRAM, URETEROSCOPY AND STENT PLACEMENT, STONE BASKETRY (Bilateral Bladder) HOLMIUM LASER APPLICATION (Bilateral Ureter)     Patient location during evaluation: PACU Anesthesia Type: General Level of consciousness: awake and alert Pain management: pain level controlled Vital Signs Assessment: post-procedure vital signs reviewed and stable Respiratory status: spontaneous breathing, nonlabored ventilation and respiratory function stable Cardiovascular status: blood pressure returned to baseline and stable Postop Assessment: no apparent nausea or vomiting Anesthetic complications: no    Last Vitals:  Vitals:   01/28/18 1615 01/28/18 1725  BP: 102/73 111/69  Pulse: 69 64  Resp: 15 16  Temp:  36.7 C  SpO2: 93% 97%    Last Pain:  Vitals:   01/28/18 1730  TempSrc:   PainSc: 6                  Beryle Lathehomas E Rolonda Pontarelli

## 2018-01-31 ENCOUNTER — Other Ambulatory Visit: Payer: Self-pay

## 2018-01-31 ENCOUNTER — Encounter (HOSPITAL_BASED_OUTPATIENT_CLINIC_OR_DEPARTMENT_OTHER): Payer: Self-pay | Admitting: Urology

## 2018-01-31 ENCOUNTER — Other Ambulatory Visit: Payer: Self-pay | Admitting: Urology

## 2018-01-31 NOTE — Op Note (Signed)
NAME:  Kathy Kim, Kathy Kim NO.:  MEDICAL RECORD NO.:  [id number]  LOCATION:                                 FACILITY:  PHYSICIAN:  Sebastian Ache, MD          DATE OF BIRTH:  DATE OF PROCEDURE: 01/28/2018                              OPERATIVE REPORT   DIAGNOSES:  Bilateral renal stones, recurrent hematuria.  PROCEDURES: 1. Cystoscopy with bilateral retrograde pyelograms and interpretation. 2. Bilateral ureteroscopy with laser lithotripsy, first stage. 3. Insertion of bilateral ureteral stents, 5 x 22 Polaris, no tether.  ESTIMATED BLOOD LOSS:  Nil.  COMPLICATION:  None.  SPECIMEN:  Bilateral renal stone fragments for compositional analysis.  FINDINGS: 1. Left greater than right multifocal intrarenal stones, total stone     volume estimated 3 cm. 2. Stones quite soft, estimated fragmentation of approximately 70% of     stone volume today using dusting technique. 3. Distal placement of bilateral ureteral stents, proximal in the     renal pelvis and distal in the urinary bladder.  INDICATION:  Kathy Kim is a pleasant 62 year old lady with history of recurrent nephrolithiasis.  She is on workup of recurrent gross hematuria and have bilateral relatively large volume renal stones, left greater than right, overall volume approximately 3 cm.  Options were discussed for management including surveillance versus staged ureteroscopy versus staged shockwave lithotripsy versus percutaneous approach surgery and she wished to proceed with staged ureteroscopy and she presents for first stage today.  Informed consent was obtained and placed in the medical record.  PROCEDURE IN DETAIL:  [name] was verified.  Procedure being bilateral first-stage ureteroscopic stone manipulation was confirmed.  Procedure was carried out.  Time-out was performed.  Intravenous antibiotics were administered.  General anesthesia was introduced.  The  patient was placed into a low lithotomy position.  Sterile field was created by prepping and draping the patient's vagina, introitus and proximal thighs using iodine. Cystourethroscopy was performed using a 22-French rigid cystoscope with offset lens.  Inspection of the urinary bladder revealed no diverticula, calcifications, papillary lesions.  The right ureteral orifice was cannulated with a 6-French end-hole catheter and right retrograde pyelogram was obtained.  Right retrograde pyelogram demonstrated a single right ureter with single-system right kidney.  There were several filling defects mostly in the lower pole calyx consistent with known stone.  A 0.038 Zip wire was advanced to the level of the upper pole, set aside as a safety wire. Similarly, left retrograde pyelogram was obtained.  Left retrograde pyelogram demonstrated a single left ureter with single- system left kidney.  There was a quite large filling defect in the renal pelvis, consistent with known stone.  A 0.038 Zip wire was advanced to the level of the upper pole, set aside as a safety wire.  An 8-French feeding tube placed in the urinary bladder for pressure release.  Semi- rigid ureteroscopy was then performed of the entire length of left ureter alongside a separate Sensor working wire.  No mucosal abnormalities were found.  Similarly, semi-rigid ureteroscopy was performed of the entire length of right ureter alongside a separate Sensor  working wire.  No mucosal abnormalities were found.  Next, the 24- cm ureteral access sheath was carefully placed over the left working Sensor wire to the level of the proximal ureter using continuous fluoroscopic guidance and flexible digital ureteroscopy was performed of the proximal left ureter and systematic inspection of the left kidney using flexible digital ureteroscope.  As expected, there was a very large renal pelvis stone with likely intermittent ball valving.   This appeared to be much too large for simple basketing.  As such, holmium laser energy applied to the stone using settings of 0.2 joules and 20 hertz, using dusting technique, approximately 70-80% of the stone was ablated.  The stone was quite soft fortunately.  There were several intrarenal stones as well that were ablated using similar technique, approximately 70% of total volume estimated of these two ablated.  There were several fragments that did appear amenable to basketing, these were grasped with an Escape basket, removed and set aside for compositional analysis.  The access sheath was removed under continuous vision.  No mucosal abnormalities were found.  Similarly, the access sheath was then placed over the right Sensor working wire to the level of the proximal ureter and systematic inspection of the right kidney performed using flexible digital ureteroscope.  There was lower pole, relatively large stone approximately 1 cm.  Given the acute angulation of the calyx, this was grasped with an Escape basket and repositioned into an upper pole calyx to allow less acute angulation and a holmium laser energy applied to this stone using similar settings, approximately 70-80% of the stone volume was ablated.  There was a central nidus that did appear more dense, that was fragmented and these fragments grasped and brought out in their entirety, set aside for compositional analysis.  The access sheath was then removed under vision, no mucosal abnormalities were found.  We achieved the goals of the first-stage procedure today.  As such, a new 5 x 22 Polaris-type stent was placed over the safety wire on the right side using fluoroscopic guidance.  Good proximal and distal deployment were noted.  Similarly, a separate 5 x 22 Polaris stent was placed over the left safety wire using fluoroscopic guidance.  Good proximal and distal deployment were noted.  Bladder was emptied per cystoscope,  procedure was then terminated.  The patient tolerated the procedure well today.  There were no immediate periprocedural complications.  The patient was taken to the postanesthesia care unit in stable condition.  We will proceed as plan for second-stage procedure in approximately 2 weeks.  Given the very large volume of fragments generated, it was felt that this stage approach would be most efficient.          ______________________________ Sebastian Acheheodore Geetika Laborde, MD     TM/MEDQ  D:  01/28/2018  T:  01/28/2018  Job:  [id number]

## 2018-01-31 NOTE — Progress Notes (Signed)
SPOKE W/ PT VIA PHONE FOR PRE-OP INTERVIEW.  NPO AFTER MN W/ EXCEPTION CLEAR LIQUIDS UNTIL 0845 (NO CREAM/MILK PRODUCTS).  ARRIVE AT 1245.  CURRENT ISTAT 8 IN CHART AND Epic.  MAY TAKE TYLENOL IF NEEDED AM DOS W/ SIPS OF WATER.

## 2018-02-11 ENCOUNTER — Ambulatory Visit (HOSPITAL_BASED_OUTPATIENT_CLINIC_OR_DEPARTMENT_OTHER): Payer: BLUE CROSS/BLUE SHIELD | Admitting: Anesthesiology

## 2018-02-11 ENCOUNTER — Encounter (HOSPITAL_BASED_OUTPATIENT_CLINIC_OR_DEPARTMENT_OTHER)
Admission: RE | Disposition: A | Discharge status: Home / Self Care | Payer: Self-pay | Source: Ambulatory Visit | Attending: Urology

## 2018-02-11 ENCOUNTER — Ambulatory Visit (HOSPITAL_BASED_OUTPATIENT_CLINIC_OR_DEPARTMENT_OTHER)
Admission: RE | Admit: 2018-02-11 | Discharge: 2018-02-11 | Disposition: A | Discharge status: Home / Self Care | Payer: BLUE CROSS/BLUE SHIELD | Source: Ambulatory Visit | Attending: Urology | Admitting: Urology

## 2018-02-11 ENCOUNTER — Encounter (HOSPITAL_BASED_OUTPATIENT_CLINIC_OR_DEPARTMENT_OTHER): Payer: Self-pay | Admitting: *Deleted

## 2018-02-11 ENCOUNTER — Other Ambulatory Visit: Payer: Self-pay

## 2018-02-11 DIAGNOSIS — E785 Hyperlipidemia, unspecified: Secondary | ICD-10-CM | POA: Insufficient documentation

## 2018-02-11 DIAGNOSIS — Z79899 Other long term (current) drug therapy: Secondary | ICD-10-CM | POA: Insufficient documentation

## 2018-02-11 DIAGNOSIS — G629 Polyneuropathy, unspecified: Secondary | ICD-10-CM | POA: Diagnosis not present

## 2018-02-11 DIAGNOSIS — Z87442 Personal history of urinary calculi: Secondary | ICD-10-CM | POA: Diagnosis not present

## 2018-02-11 DIAGNOSIS — Z87891 Personal history of nicotine dependence: Secondary | ICD-10-CM | POA: Insufficient documentation

## 2018-02-11 DIAGNOSIS — R82994 Hypercalciuria: Secondary | ICD-10-CM | POA: Diagnosis not present

## 2018-02-11 DIAGNOSIS — Z791 Long term (current) use of non-steroidal anti-inflammatories (NSAID): Secondary | ICD-10-CM | POA: Insufficient documentation

## 2018-02-11 DIAGNOSIS — N200 Calculus of kidney: Secondary | ICD-10-CM | POA: Insufficient documentation

## 2018-02-11 HISTORY — PX: CYSTOSCOPY WITH RETROGRADE PYELOGRAM, URETEROSCOPY AND STENT PLACEMENT: SHX5789

## 2018-02-11 HISTORY — DX: Calculus of kidney: N20.0

## 2018-02-11 HISTORY — DX: Other symptoms and signs involving the genitourinary system: R39.89

## 2018-02-11 SURGERY — CYSTOURETEROSCOPY, WITH RETROGRADE PYELOGRAM AND STENT INSERTION
Anesthesia: General | Site: Ureter | Laterality: Bilateral

## 2018-02-11 MED ORDER — CEPHALEXIN 500 MG PO CAPS: 500.0000 mg | ORAL_CAPSULE | Freq: Two times a day (BID) | ORAL | 0 refills | Status: DC

## 2018-02-11 MED ORDER — KETOROLAC TROMETHAMINE 10 MG PO TABS: 10.0000 mg | ORAL_TABLET | Freq: Four times a day (QID) | ORAL | 0 refills | Status: DC | PRN

## 2018-02-11 MED ORDER — TRAMADOL HCL 50 MG PO TABS
ORAL_TABLET | ORAL | Status: AC
  Filled 2018-02-11: qty 1

## 2018-02-11 MED ORDER — ONDANSETRON HCL 4 MG/2ML IJ SOLN
INTRAMUSCULAR | Status: DC | PRN
  Administered 2018-02-11: 4 mg via INTRAVENOUS

## 2018-02-11 MED ORDER — SODIUM CHLORIDE 0.9 % IR SOLN
Status: DC | PRN
  Administered 2018-02-11: 4000 mL

## 2018-02-11 MED ORDER — DEXAMETHASONE SODIUM PHOSPHATE 10 MG/ML IJ SOLN
INTRAMUSCULAR | Status: AC
  Filled 2018-02-11: qty 1

## 2018-02-11 MED ORDER — OXYCODONE HCL 5 MG PO TABS
5.0000 mg | ORAL_TABLET | Freq: Once | ORAL | Status: DC | PRN
  Filled 2018-02-11: qty 1

## 2018-02-11 MED ORDER — PROMETHAZINE HCL 25 MG/ML IJ SOLN
6.2500 mg | INTRAMUSCULAR | Status: DC | PRN
  Filled 2018-02-11: qty 1

## 2018-02-11 MED ORDER — FENTANYL CITRATE (PF) 100 MCG/2ML IJ SOLN
INTRAMUSCULAR | Status: DC | PRN
  Administered 2018-02-11 (×4): 25 ug via INTRAVENOUS
  Administered 2018-02-11: 12.5 ug via INTRAVENOUS
  Administered 2018-02-11 (×3): 25 ug via INTRAVENOUS
  Administered 2018-02-11: 12.5 ug via INTRAVENOUS

## 2018-02-11 MED ORDER — TRAMADOL HCL 50 MG PO TABS
50.0000 mg | ORAL_TABLET | Freq: Four times a day (QID) | ORAL | Status: DC | PRN
  Administered 2018-02-11: 50 mg via ORAL
  Filled 2018-02-11: qty 1

## 2018-02-11 MED ORDER — DEXTROSE 5 % IV SOLN
5.0000 mg/kg | INTRAVENOUS | Status: AC
  Administered 2018-02-11: 290 mg via INTRAVENOUS
  Filled 2018-02-11 (×2): qty 7.25

## 2018-02-11 MED ORDER — PROPOFOL 10 MG/ML IV BOLUS
INTRAVENOUS | Status: DC | PRN
  Administered 2018-02-11: 200 mg via INTRAVENOUS
  Administered 2018-02-11: 100 mg via INTRAVENOUS

## 2018-02-11 MED ORDER — MIDAZOLAM HCL 5 MG/5ML IJ SOLN
INTRAMUSCULAR | Status: DC | PRN
  Administered 2018-02-11: 2 mg via INTRAVENOUS

## 2018-02-11 MED ORDER — KETOROLAC TROMETHAMINE 30 MG/ML IJ SOLN
INTRAMUSCULAR | Status: DC | PRN
  Administered 2018-02-11: 30 mg via INTRAVENOUS

## 2018-02-11 MED ORDER — LIDOCAINE 2% (20 MG/ML) 5 ML SYRINGE
INTRAMUSCULAR | Status: AC
  Filled 2018-02-11: qty 5

## 2018-02-11 MED ORDER — PROPOFOL 10 MG/ML IV BOLUS
INTRAVENOUS | Status: AC
  Filled 2018-02-11: qty 20

## 2018-02-11 MED ORDER — DEXAMETHASONE SODIUM PHOSPHATE 4 MG/ML IJ SOLN
INTRAMUSCULAR | Status: DC | PRN
  Administered 2018-02-11: 10 mg via INTRAVENOUS

## 2018-02-11 MED ORDER — FENTANYL CITRATE (PF) 100 MCG/2ML IJ SOLN
INTRAMUSCULAR | Status: AC
  Filled 2018-02-11: qty 2

## 2018-02-11 MED ORDER — IOHEXOL 300 MG/ML  SOLN
INTRAMUSCULAR | Status: DC | PRN
  Administered 2018-02-11 (×2): 8 mL

## 2018-02-11 MED ORDER — KETOROLAC TROMETHAMINE 30 MG/ML IJ SOLN
INTRAMUSCULAR | Status: AC
  Filled 2018-02-11: qty 1

## 2018-02-11 MED ORDER — LIDOCAINE HCL (CARDIAC) 20 MG/ML IV SOLN
INTRAVENOUS | Status: DC | PRN
  Administered 2018-02-11: 100 mg via INTRAVENOUS

## 2018-02-11 MED ORDER — OXYCODONE HCL 5 MG/5ML PO SOLN
5.0000 mg | Freq: Once | ORAL | Status: DC | PRN
  Filled 2018-02-11: qty 5

## 2018-02-11 MED ORDER — HYDROMORPHONE HCL 1 MG/ML IJ SOLN
0.2500 mg | INTRAMUSCULAR | Status: DC | PRN
  Filled 2018-02-11: qty 0.5

## 2018-02-11 MED ORDER — LACTATED RINGERS IV SOLN
INTRAVENOUS | Status: DC
  Administered 2018-02-11 (×2): via INTRAVENOUS
  Filled 2018-02-11: qty 1000

## 2018-02-11 MED ORDER — GENTAMICIN SULFATE 40 MG/ML IJ SOLN
5.0000 mg/kg | INTRAVENOUS | Status: DC
  Filled 2018-02-11: qty 9

## 2018-02-11 MED ORDER — TRAMADOL HCL 50 MG PO TABS: 50.0000 mg | ORAL_TABLET | Freq: Four times a day (QID) | ORAL | 0 refills | Status: AC | PRN

## 2018-02-11 MED ORDER — ONDANSETRON HCL 4 MG/2ML IJ SOLN
INTRAMUSCULAR | Status: AC
  Filled 2018-02-11: qty 2

## 2018-02-11 MED ORDER — MIDAZOLAM HCL 2 MG/2ML IJ SOLN
INTRAMUSCULAR | Status: AC
  Filled 2018-02-11: qty 2

## 2018-02-11 MED FILL — KETOROLAC 10 MG TABLET: 10 | 31 days supply | Qty: 20 | Fill #0

## 2018-02-11 MED FILL — CEPHALEXIN 500 MG CAPSULE: 500 | 3 days supply | Qty: 6 | Fill #0

## 2018-02-11 MED FILL — traMADol HCL 50 MG TABS: 50 | 3 days supply | Qty: 20 | Fill #0

## 2018-02-11 SURGICAL SUPPLY — 28 items
BAG DRAIN URO-CYSTO SKYTR STRL (DRAIN) ×4 IMPLANT
BAG DRN UROCATH (DRAIN) ×2
BASKET LASER NITINOL 1.9FR (BASKET) IMPLANT
BASKET STONE NCOMPASS (UROLOGICAL SUPPLIES) ×4 IMPLANT
BSKT STON RTRVL 120 1.9FR (BASKET)
CATH INTERMIT  6FR 70CM (CATHETERS) ×4 IMPLANT
CLOTH BEACON ORANGE TIMEOUT ST (SAFETY) ×4 IMPLANT
DRSG TEGADERM 2-3/8X2-3/4 SM (GAUZE/BANDAGES/DRESSINGS) ×4 IMPLANT
FIBER LASER FLEXIVA 365 (UROLOGICAL SUPPLIES) IMPLANT
FIBER LASER TRAC TIP (UROLOGICAL SUPPLIES) IMPLANT
GLOVE BIO SURGEON STRL SZ7.5 (GLOVE) ×4 IMPLANT
GOWN STRL REUS W/TWL LRG LVL3 (GOWN DISPOSABLE) ×8 IMPLANT
GUIDEWIRE ANG ZIPWIRE 038X150 (WIRE) ×4 IMPLANT
GUIDEWIRE STR DUAL SENSOR (WIRE) ×4 IMPLANT
INFUSOR MANOMETER BAG 3000ML (MISCELLANEOUS) ×4 IMPLANT
IV NS 1000ML (IV SOLUTION) ×3
IV NS 1000ML BAXH (IV SOLUTION) ×2 IMPLANT
IV NS IRRIG 3000ML ARTHROMATIC (IV SOLUTION) ×4 IMPLANT
KIT RM TURNOVER CYSTO AR (KITS) ×4 IMPLANT
MANIFOLD NEPTUNE II (INSTRUMENTS) ×4 IMPLANT
NS IRRIG 500ML POUR BTL (IV SOLUTION) ×4 IMPLANT
PACK CYSTO (CUSTOM PROCEDURE TRAY) ×4 IMPLANT
STENT POLARIS 5FRX22 (STENTS) IMPLANT
STENT POLARIS 5FRX24 (STENTS) ×8 IMPLANT
SYRINGE 10CC LL (SYRINGE) ×4 IMPLANT
TUBE CONNECTING 12'X1/4 (SUCTIONS) ×1
TUBE CONNECTING 12X1/4 (SUCTIONS) ×3 IMPLANT
TUBE FEEDING 8FR 16IN STR KANG (MISCELLANEOUS) ×4 IMPLANT

## 2018-02-11 NOTE — Anesthesia Preprocedure Evaluation (Signed)
Anesthesia Evaluation  Patient identified by MRN, date of birth, ID band Patient awake    Reviewed: Allergy & Precautions, H&P , NPO status , Patient's Chart, lab work & pertinent test results  Airway Mallampati: II  TM Distance: >3 FB Neck ROM: Full    Dental no notable dental hx. (+) Dental Advisory Given, Teeth Intact   Pulmonary former smoker,    breath sounds clear to auscultation       Cardiovascular negative cardio ROS   Rhythm:Regular Rate:Normal     Neuro/Psych Depression Peripheral neuropathy b/l hands    GI/Hepatic Neg liver ROS, GERD  Controlled,  Endo/Other  negative endocrine ROS  Renal/GU Nephrolithiaisis     Musculoskeletal  (+) Arthritis ,   Abdominal   Peds  Hematology negative hematology ROS (+)   Anesthesia Other Findings   Reproductive/Obstetrics                             Anesthesia Physical  Anesthesia Plan  ASA: II  Anesthesia Plan: General   Post-op Pain Management:    Induction: Intravenous  PONV Risk Score and Plan: 3 and Ondansetron, Dexamethasone, Midazolam and Treatment may vary due to age or medical condition  Airway Management Planned: LMA  Additional Equipment: None  Intra-op Plan:   Post-operative Plan: Extubation in OR  Informed Consent: I have reviewed the patients History and Physical, chart, labs and discussed the procedure including the risks, benefits and alternatives for the proposed anesthesia with the patient or authorized representative who has indicated his/her understanding and acceptance.   Dental advisory given  Plan Discussed with: CRNA  Anesthesia Plan Comments:         Anesthesia Quick Evaluation

## 2018-02-11 NOTE — Discharge Instructions (Signed)
1 - You may have urinary urgency (bladder spasms) and bloody urine on / off with stent in place. This is normal. Please stay VERY well hydrated over next 3-5 days.   2 - Remove tethered stents on Monday morning at home by pulling on strings, then blue-white plastic tubing and discarding. There two stents. Office is open Monday if any acute issues arise.   3-  Call MD or go to ER for fever >102, severe pain / nausea / vomiting not relieved by medications, or acute change in medical status  Post Anesthesia Home Care Instructions  Activity: Get plenty of rest for the remainder of the day. A responsible individual must stay with you for 24 hours following the procedure.  For the next 24 hours, DO NOT: -Drive a car -Advertising copywriterperate machinery -Drink alcoholic beverages -Take any medication unless instructed by your physician -Make any legal decisions or sign important papers.  Meals: Start with liquid foods such as gelatin or soup. Progress to regular foods as tolerated. Avoid greasy, spicy, heavy foods. If nausea and/or vomiting occur, drink only clear liquids until the nausea and/or vomiting subsides. Call your physician if vomiting continues.  Special Instructions/Symptoms: Your throat may feel dry or sore from the anesthesia or the breathing tube placed in your throat during surgery. If this causes discomfort, gargle with warm salt water. The discomfort should disappear within 24 hours.  If you had a scopolamine patch placed behind your ear for the management of post- operative nausea and/or vomiting:  1. The medication in the patch is effective for 72 hours, after which it should be removed.  Wrap patch in a tissue and discard in the trash. Wash hands thoroughly with soap and water. 2. You may remove the patch earlier than 72 hours if you experience unpleasant side effects which may include dry mouth, dizziness or visual disturbances. 3. Avoid touching the patch. Wash your hands with soap and water  after contact with the patch.

## 2018-02-11 NOTE — Anesthesia Procedure Notes (Signed)
Procedure Name: LMA Insertion Date/Time: 02/11/2018 3:37 PM Performed by: Jessica PriestBeeson, Nou Chard C, CRNA Pre-anesthesia Checklist: Patient identified, Emergency Drugs available, Suction available and Patient being monitored Patient Re-evaluated:Patient Re-evaluated prior to induction Oxygen Delivery Method: Circle system utilized Preoxygenation: Pre-oxygenation with 100% oxygen Induction Type: IV induction Ventilation: Mask ventilation without difficulty LMA: LMA inserted LMA Size: 4.0 Number of attempts: 1 Airway Equipment and Method: Bite block Placement Confirmation: positive ETCO2 and breath sounds checked- equal and bilateral Tube secured with: Tape Dental Injury: Teeth and Oropharynx as per pre-operative assessment

## 2018-02-11 NOTE — H&P (Signed)
Kathy Kim is an 62 y.o. female.    Chief Complaint: Pre-op BILATERAL 2nd Stage Ureteroscopic Stone Manipulation  HPI:    1 - Bilateral Recurrent Nephrolithiasis - Left 14mm upper, 1cm lower and Rt 1cm lower renal sontes by CT 12/2017 on eval gross hematuria. She has known hypercalciuria. Underwent 1st stage bilateral ureteroscopy on 2/8 at which point estimate 70% stone volume addressed and bilateral 5x22 stents placed.   Today "Kathy Kim" is seen to proceed with bilateral second stage ureteroscopic stone manipulation for large volume renal stones in setting of hematuria. NO interval fevers. Most recent UCx negative.  Past Medical History:  Diagnosis Date  . Arthritis   . De Quervain's disease (tenosynovitis)    Left  . Depression   . Frequency of urination   . GERD (gastroesophageal reflux disease)   . Headache   . Heart murmur   . Hematuria   . History of kidney stones   . Hyperlipidemia   . Inguinal hernia    Right  . Nephrolithiasis    bilateral   . Numbness and tingling in both hands   . Ovarian cyst   . Rosacea   . Sensation of pressure in bladder area   . Sigmoid diverticulosis 06/21/2017   Noted on CT abd/pelvis at [facility]  . Urge urinary incontinence     Past Surgical History:  Procedure Laterality Date  . ABDOMINOPLASTY  JAN 2014  . CARDIAC CATHETERIZATION  09-05-2007  DR BERRY   NORMAL CORONARIES AND LVF  . COLONOSCOPY    . CYSTOSCOPY W/ URETERAL STENT PLACEMENT Right 05/11/2013   Procedure: CYSTOSCOPY WITH RETROGRADE PYELOGRAM/URETERAL STENT PLACEMENT, BLADDER BIOPSY;  Surgeon: Milford Cage, MD;  Location: [facility];  Service: Urology;  Laterality: Right;  cysto, bil retrogrades, stent placement   . CYSTOSCOPY W/ URETERAL STENT PLACEMENT Bilateral 05/23/2013   Procedure: CYSTOSCOPY WITH RETROGRADE PYELOGRAM/URETERAL STENT PLACEMENT;  Surgeon: Kathi Ludwig, MD;  Location: [facility];  Service: Urology;  Laterality: Bilateral;  .  CYSTOSCOPY W/ URETERAL STENT PLACEMENT Bilateral 05/31/2013   Procedure: CYSTOSCOPY WITH STENT REPLACEMENT;  Surgeon: Milford Cage, MD;  Location: [facility];  Service: Urology;  Laterality: Bilateral;  . CYSTOSCOPY WITH RETROGRADE PYELOGRAM, URETEROSCOPY AND STENT PLACEMENT Right 05/18/2013   Procedure: CYSTOSCOPY WITH RETROGRADE PYELOGRAM, URETEROSCOPY AND STENT PLACEMENT;  Surgeon: Milford Cage, MD;  Location: [facility];  Service: Urology;  Laterality: Right;  STENT EXCHANGE   . CYSTOSCOPY WITH RETROGRADE PYELOGRAM, URETEROSCOPY AND STENT PLACEMENT Bilateral 01/28/2018   Procedure: CYSTOSCOPY WITH RETROGRADE PYELOGRAM, URETEROSCOPY AND STENT PLACEMENT, STONE BASKETRY;  Surgeon: Sebastian Ache, MD;  Location: [facility];  Service: Urology;  Laterality: Bilateral;  . CYSTOSCOPY/RETROGRADE/URETEROSCOPY/STONE EXTRACTION WITH BASKET Bilateral 05/31/2013   Procedure: CYSTOSCOPY/RETROGRADE/URETEROSCOPY/STONE EXTRACTION WITH BASKET;  Surgeon: Milford Cage, MD;  Location: [facility];  Service: Urology;  Laterality: Bilateral;  . EXCISIONAL HEMORRHOIDECTOMY    . HOLMIUM LASER APPLICATION Right 05/18/2013   Procedure: HOLMIUM LASER APPLICATION;  Surgeon: Milford Cage, MD;  Location: [facility];  Service: Urology;  Laterality: Right;  . HOLMIUM LASER APPLICATION Bilateral 01/28/2018   Procedure: HOLMIUM LASER APPLICATION;  Surgeon: Sebastian Ache, MD;  Location: [facility];  Service: Urology;  Laterality: Bilateral;  . NM MYOCAR PERF WALL MOTION  07/02/2005   negative  . RIGHT URETEROSCOPIC STONE EXTRACITON  03-23-2001  . TONSILLECTOMY    . TUBAL LIGATION    . TYMPANOPLASTY  AGE 77    Family History  Problem Relation Age of Onset  . Arthritis Mother   . Mental illness Mother   . Hypertension Mother   . Stroke Mother   . Diabetes Father   . Heart disease Father   .  Hypertension Father   . Cancer Paternal Grandmother   . Hypertension Brother    Social History:  reports that she quit smoking about 10 years ago. Her smoking use included cigarettes. She has a 3.75 pack-year smoking history. she has never used smokeless tobacco. She reports that she drinks alcohol. She reports that she does not use drugs.  Allergies:  Allergies  Allergen Reactions  . Tetracyclines & Related Hives  . Codeine Rash    No medications prior to admission.    No results found for this or any previous visit (from the past 48 hour(s)). No results found.  Review of Systems  Constitutional: Negative.   HENT: Negative.   Eyes: Negative.   Respiratory: Negative.   Cardiovascular: Negative.   Gastrointestinal: Negative.   Genitourinary: Negative.   Musculoskeletal: Negative.   Skin: Negative.   Neurological: Negative.   Endo/Heme/Allergies: Negative.   Psychiatric/Behavioral: Negative.     There were no vitals taken for this visit. Physical Exam  Constitutional: She appears well-developed.  HENT:  Head: Normocephalic.  Eyes: Pupils are equal, round, and reactive to light.  Neck: Normal range of motion.  Cardiovascular: Normal rate.  Respiratory: Effort normal.  GI: Soft.  Genitourinary:  Genitourinary Comments: NO CVAT at present.   Musculoskeletal: Normal range of motion.  Neurological: She is alert.  Skin: Skin is warm.  Psychiatric: She has a normal mood and affect.     Assessment/Plan  1 - Bilateral Recurrent Nephrolithiasis - proceed as planned with BILATERAL 2nd stage ureteroscopy with goal of stone free. Risks, benefits, alternatives, expected peri-op course discussed previously and reiterated today.   Sebastian AcheMANNY, Irvin Lizama, MD 02/11/2018, 7:33 AM

## 2018-02-11 NOTE — Transfer of Care (Signed)
Immediate Anesthesia Transfer of Care Note  Patient: Kathy Kim  Procedure(s) Performed: Procedure(s) (LRB): CYSTOSCOPY WITH RETROGRADE PYELOGRAM, URETEROSCOPY AND STENT PLACEMENT BASKET EXTRACTION (Bilateral)  Patient Location: PACU  Anesthesia Type: General  Level of Consciousness: awake, sedated, patient cooperative and responds to stimulation  Airway & Oxygen Therapy: Patient Spontanous Breathing and Patient connected to NCO2  Post-op Assessment: Report given to PACU RN, Post -op Vital signs reviewed and stable and Patient moving all extremities  Post vital signs: Reviewed and stable  Complications: No apparent anesthesia complications

## 2018-02-11 NOTE — Anesthesia Postprocedure Evaluation (Signed)
Anesthesia Post Note  Patient: Kathy Kim  Procedure(s) Performed: CYSTOSCOPY WITH RETROGRADE PYELOGRAM, URETEROSCOPY AND STENT PLACEMENT BASKET EXTRACTION (Bilateral Ureter)     Patient location during evaluation: PACU Anesthesia Type: General Level of consciousness: awake and alert Pain management: pain level controlled Vital Signs Assessment: post-procedure vital signs reviewed and stable Respiratory status: spontaneous breathing, nonlabored ventilation, respiratory function stable and patient connected to nasal cannula oxygen Cardiovascular status: blood pressure returned to baseline and stable Postop Assessment: no apparent nausea or vomiting Anesthetic complications: no    Last Vitals:  Vitals:   02/11/18 1715 02/11/18 1725  BP: (!) 103/57 116/72  Pulse: 66 69  Resp: 11 13  Temp:    SpO2: 94% 96%    Last Pain:  Vitals:   02/11/18 1251  TempSrc: Oral                 Shelton SilvasKevin D Jessamyn Watterson

## 2018-02-11 NOTE — Brief Op Note (Signed)
02/11/2018  4:35 PM  PATIENT:  Lawanna KobusKelly P Boulais  62 y.o. female  PRE-OPERATIVE DIAGNOSIS:  BILATERAL RENAL STONES  POST-OPERATIVE DIAGNOSIS:  BILATERAL RENAL STONES  PROCEDURE:  Procedure(s): CYSTOSCOPY WITH RETROGRADE PYELOGRAM, URETEROSCOPY AND STENT PLACEMENT BASKET EXTRACTION (Bilateral)  SURGEON:  Surgeon(s) and Role:    Sebastian Ache* Rayhan Groleau, MD - Primary  PHYSICIAN ASSISTANT:   ASSISTANTS: none   ANESTHESIA:   general  EBL:  0 mL   BLOOD ADMINISTERED:none  DRAINS: none   LOCAL MEDICATIONS USED:  NONE  SPECIMEN:  Source of Specimen:  bilateral renal stone fragments  DISPOSITION OF SPECIMEN:  discard  COUNTS:  YES  TOURNIQUET:  * No tourniquets in log *  DICTATION: .Other Dictation: Dictation Number [id number]  PLAN OF CARE: Discharge to home after PACU  PATIENT DISPOSITION:  PACU - hemodynamically stable.   Delay start of Pharmacological VTE agent (>24hrs) due to surgical blood loss or risk of bleeding: yes

## 2018-02-14 ENCOUNTER — Encounter (HOSPITAL_BASED_OUTPATIENT_CLINIC_OR_DEPARTMENT_OTHER): Payer: Self-pay | Admitting: Urology

## 2018-02-14 NOTE — Op Note (Signed)
NAME:  Kathy Kim, Kathy Kim                    ACCOUNT NO.:  MEDICAL RECORD NO.:  [id number]  LOCATION:                                 FACILITY:  PHYSICIAN:  Sebastian Acheheodore Dickey Caamano, MD          DATE OF BIRTH:  DATE OF PROCEDURE: 02/11/2018                                OPERATIVE REPORT   PREOPERATIVE DIAGNOSIS:  Residual bilateral renal stones, status post first-stage procedure.  POSTOPERATIVE DIAGNOSIS:  Residual bilateral renal stones, status post first-stage procedure.  PROCEDURES: 1. Cystoscopy with bilateral retrograde pyelograms and interpretation. 2. Exchange of bilateral ureteral stents, 5 x 24 Polaris with tether. 3. Bilateral ureteroscopy with basketing of stones.  ESTIMATED BLOOD LOSS:  Nil.  COMPLICATION:  None.  SPECIMEN:  Bilateral renal stone fragments for discard.  FINDINGS: 1. Left greater than right small-volume residual stone mostly in the     lower pole. 2. Complete resolution of all stone fragments larger than 1/3rd mm     following basket extraction bilaterally. 3. Successful replacement of bilateral stents, proximal end in the     renal pelvis and distal end in the urinary bladder.  INDICATION:  [name] is a pleasant 62 year old lady with history of recurrent nephrolithiasis.  She has had recurrent symptomatic hematuria approximately 3 cm total volume, 2 cm on the left, 1 cm on the right. She is status post first-stage procedure earlier this month.  She presents for second-stage procedure today.  Informed consent was obtained and placed in the medical record.  PROCEDURE IN DETAIL:  The patient being, Kathy Kim, was verified. Procedure being bilateral second-stage ureteroscopic stone manipulation was confirmed.  Procedure was carried out.  Time-out was performed. Intravenous antibiotics were administered.  General anesthesia was introduced.  The patient was placed into a low lithotomy position. Sterile field was created by prepping and draping the  patient's vagina, introitus, and proximal thighs using iodine.  Next, cystourethroscopy was performed using a 22-French rigid cystoscope with offset lens. Inspection of the urinary bladder revealed distal end of bilateral stents in situ.  The distal end of the left stent was grasped, brought to the level of the urethral meatus.  Through which, a 0.038 Zip wire was advanced to the level of the upper pole, set aside as a safety wire and exchanged for an open-ended catheter and left retrograde pyelogram was obtained.  Left retrograde pyelogram demonstrated a single left ureter with single- system left kidney.  No filling defects or narrowing noted.  The Zip wire was once again advanced, set aside as a safety wire.  Similarly, the right distal stent was grasped, brought to the level of the urethral meatus.  Then, a separate Zip wire was advanced to the level of the upper pole of the right kidney, exchanged for an open-ended catheter and right retrograde pyelogram was obtained.  Right retrograde pyelogram demonstrated a single right ureter with single-system right kidney.  No filling defects or narrowing noted.  The Zip wire was once again advanced, set aside as a safety wire.  An 8- JamaicaFrench feeding tube placed in the urinary bladder for pressure release. Next, semi-rigid ureteroscopy was performed to  the bilateral ureters to the level of the UPJ, no evidence of intraluminal stones were seen and a Sensor wire was left in place as working wire bilaterally.  Then, on the left side, a 24-cm 12/14 ureteral access sheath was placed over the Sensor working wire at the level of the proximal ureter using fluoroscopic guidance and flexible digital ureteroscopy was performed of the proximal left ureter and systematic inspection of the left kidney. There was multifocal small volume residual stone as well as some inflammatory debris, this was all amenable to simple basketing using combination of Escape  basket and NCompass basket.  Such that, all stone fragments larger than 1/3rd mm had been retrieved and removed in their entirety.  The access sheath was removed under continuous vision.  No mucosal abnormalities were found.  The access sheath was then placed over the right side at the level of the proximal ureter using fluoroscopic guidance, and flexible digital ureteroscopy was performed on the right side.  There was a small-volume residual stone in the right kidney mostly mid-pole, this was managed with the NCompass basket.  Such that, all stone fragments larger than 1/3rd mm were removed.  The access sheath was removed on the right side using continuous visualization, no mucosal abnormalities were found.  Given bilateral natural of procedure, it was felt that brief interval stenting would be warranted with tethered stents.  As such, a new 5 x 24 Polaris-type stent was placed over the remaining safety wires bilaterally using fluoroscopic guidance. Good proximal and distal deployment were noted.  Tethers were left in place, fashioned to the mons pubis, and the procedure was terminated. The patient tolerated the procedure well.  There were no immediate periprocedural complications.  The patient was taken to the postanesthesia care unit in stable condition.          ______________________________ Sebastian Ache, MD     TM/MEDQ  D:  02/11/2018  T:  02/12/2018  Job:  [id number]

## 2018-05-18 ENCOUNTER — Ambulatory Visit (INDEPENDENT_AMBULATORY_CARE_PROVIDER_SITE_OTHER): Payer: BLUE CROSS/BLUE SHIELD | Admitting: Family

## 2018-05-18 ENCOUNTER — Ambulatory Visit (INDEPENDENT_AMBULATORY_CARE_PROVIDER_SITE_OTHER): Payer: Self-pay | Admitting: Orthopedic Surgery

## 2018-05-18 DIAGNOSIS — M7711 Lateral epicondylitis, right elbow: Secondary | ICD-10-CM

## 2018-05-18 DIAGNOSIS — S86899A Other injury of other muscle(s) and tendon(s) at lower leg level, unspecified leg, initial encounter: Secondary | ICD-10-CM | POA: Diagnosis not present

## 2018-05-19 ENCOUNTER — Encounter (INDEPENDENT_AMBULATORY_CARE_PROVIDER_SITE_OTHER): Payer: Self-pay | Admitting: Family

## 2018-05-19 NOTE — Progress Notes (Signed)
Office Visit Note   Patient: Kathy Kim           Date of Birth: [birth year]           MRN: [id number] Visit Date: 05/18/2018              Requested by: Eartha Inch, MD [address] [place], [place] [address] PCP: Eartha Inch, MD  No chief complaint on file.     HPI: The patient is a 62 year old woman who presents today for initial evaluation of 2 separate issues.  #1 she has been having anterior shin pain with walking.  She states over the last 4 weeks she has been increasing her activities but is often walking up to 10 miles a day which is new for her.  She complains that pain comes on with a fast pace.  When she decreases her walking pace the pain resolves.  Points to the interior shin the distal aspect bilaterally.  Has been taking ibuprofen with moderate relief of her pain. The issue is some right elbow pain this is been ongoing for about 3 weeks.  Noted that it began after she unloaded many bales of high needles and spread these around her arm.  It is her dominant arm the right elbow.  Points to the lateral epicondyle.  Has pain when lifting some mild tenderness to touch but no swelling no erythema.  No injury.  Assessment & Plan: Visit Diagnoses:  1. Lateral epicondylitis, right elbow   2. Shin splints, initial encounter     Plan: Activity modification.  Rest.  Will use anti-inflammatories patient agrees to use 6 to 800 mg of ibuprofen twice daily for the next 2 weeks.  Given a elbow strap for the lateral epicondylitis on the right.  May resume activities as tolerated as symptoms resolved.  Follow-Up Instructions: Return in about 1 month (around 06/15/2018), or if symptoms worsen or fail to improve.   Right Ankle Exam  Right ankle exam is normal.   Left Ankle Exam  Left ankle exam is normal.   Right Elbow Exam   Tenderness  The patient is experiencing tenderness in the lateral epicondyle.   Range of Motion  The patient has normal right elbow  ROM.  Muscle Strength  The patient has normal right elbow strength.  Other  Erythema: absent Sensation: normal Pulse: present      Patient is alert, oriented, no adenopathy, well-dressed, normal affect, normal respiratory effort. Mild tenderness along the distal tibia bilaterally.  Skin exam normal.  Imaging: No results found. No images are attached to the encounter.  Labs: Lab Results  Component Value Date   REPTSTATUS 05/25/2013 FINAL 05/23/2013   CULT ESCHERICHIA COLI 05/23/2013   LABORGA ESCHERICHIA COLI 05/23/2013     Lab Results  Component Value Date   ALBUMIN 3.5 05/23/2013   ALBUMIN 3.7 09/05/2007    There is no height or weight on file to calculate BMI.  Orders:  No orders of the defined types were placed in this encounter.  No orders of the defined types were placed in this encounter.    Procedures: No procedures performed  Clinical Data: No additional findings.  ROS:  All other systems negative, except as noted in the HPI. Review of Systems  Constitutional: Negative for chills and fever.  Cardiovascular: Negative for leg swelling.  Musculoskeletal: Positive for arthralgias and myalgias. Negative for joint swelling.  Skin: Negative for color change.  Neurological: Negative for weakness.  Objective: Vital Signs: There were no vitals taken for this visit.  Specialty Comments:  No specialty comments available.  PMFS History: There are no active problems to display for this patient.  Past Medical History:  Diagnosis Date  . Arthritis   . De Quervain's disease (tenosynovitis)    Left  . Depression   . Frequency of urination   . GERD (gastroesophageal reflux disease)   . Headache   . Heart murmur   . Hematuria   . History of kidney stones   . Hyperlipidemia   . Inguinal hernia    Right  . Nephrolithiasis    bilateral   . Numbness and tingling in both hands   . Ovarian cyst   . Rosacea   . Sensation of pressure in bladder  area   . Sigmoid diverticulosis 06/21/2017   Not[facility] on [facility]vis [facility]  . Urge urinary incontinence     Family History  Problem Relation Age of Onset  . Arthritis Mother   . Mental illness Mother   . Hypertension Mother   . Stroke Mother   . Diabetes Father   . Heart disease Father   . Hypertension Father   . Cancer Paternal Grandmother   . Hypertension Brother     Past Surgical History:  Procedure Laterality Date  . ABDOMINOPLASTY  JAN 2014  . CARDIAC CATHETERIZATION  09-05-2007  DR BERRY   NORMAL CORONARIES AND LVF  . COLONOSCOPY    . CYSTOSCOPY W/ URETERAL STENT PLACEMENT Right 05/11/2013   Procedure: CYSTOSCOPY WITH RETROGRADE PYELOGRAM/URETERAL STENT PLACEMENT, BLADDER BIOPSY;  Surgeon: Milford Cage, MD;  Location: [facility];  Service: Urology;  Laterality: Right;  cysto, bil retrogrades, stent placement   . CYSTOSCOPY W/ URETERAL STENT PLACEMENT Bilateral 05/23/2013   Procedure: CYSTOSCOPY WITH RETROGRADE PYELOGRAM/URETERAL STENT PLACEMENT;  Surgeon: Kathi Ludwig, MD;  Location: [facility];  Service: Urology;  Laterality: Bilateral;  . CYSTOSCOPY W/ URETERAL STENT PLACEMENT Bilateral 05/31/2013   Procedure: CYSTOSCOPY WITH STENT REPLACEMENT;  Surgeon: Milford Cage, MD;  Location: [facility];  Service: Urology;  Laterality: Bilateral;  . CYSTOSCOPY WITH RETROGRADE PYELOGRAM, URETEROSCOPY AND STENT PLACEMENT Right 05/18/2013   Procedure: CYSTOSCOPY WITH RETROGRADE PYELOGRAM, URETEROSCOPY AND STENT PLACEMENT;  Surgeon: Milford Cage, MD;  Location: [facility];  Service: Urology;  Laterality: Right;  STENT EXCHANGE   . CYSTOSCOPY WITH RETROGRADE PYELOGRAM, URETEROSCOPY AND STENT PLACEMENT Bilateral 01/28/2018   Procedure: CYSTOSCOPY WITH RETROGRADE PYELOGRAM, URETEROSCOPY AND STENT PLACEMENT, STONE BASKETRY;  Surgeon: Sebastian Ache, MD;  Location: [facility];  Service: Urology;   Laterality: Bilateral;  . CYSTOSCOPY WITH RETROGRADE PYELOGRAM, URETEROSCOPY AND STENT PLACEMENT Bilateral 02/11/2018   Procedure: CYSTOSCOPY WITH RETROGRADE PYELOGRAM, URETEROSCOPY AND STENT PLACEMENT BASKET EXTRACTION;  Surgeon: Sebastian Ache, MD;  Location: [facility];  Service: Urology;  Laterality: Bilateral;  . CYSTOSCOPY/RETROGRADE/URETEROSCOPY/STONE EXTRACTION WITH BASKET Bilateral 05/31/2013   Procedure: CYSTOSCOPY/RETROGRADE/URETEROSCOPY/STONE EXTRACTION WITH BASKET;  Surgeon: Milford Cage, MD;  Location: [facility];  Service: Urology;  Laterality: Bilateral;  . EXCISIONAL HEMORRHOIDECTOMY    . HOLMIUM LASER APPLICATION Right 05/18/2013   Procedure: HOLMIUM LASER APPLICATION;  Surgeon: Milford Cage, MD;  Location: [facility];  Service: Urology;  Laterality: Right;  . HOLMIUM LASER APPLICATION Bilateral 01/28/2018   Procedure: HOLMIUM LASER APPLICATION;  Surgeon: Sebastian Ache, MD;  Location[facility];  Service: Urology;  Laterality: Bilateral;  . NM MYOCAR PERF  WALL MOTION  07/02/2005   negative  . RIGHT URETEROSCOPIC STONE EXTRACITON  03-23-2001  . TONSILLECTOMY    . TUBAL LIGATION    . TYMPANOPLASTY  AGE 57   Social History   Occupational History  . Not on file  Tobacco Use  . Smoking status: Former Smoker    Packs/day: 0.25    Years: 15.00    Pack years: 3.75    Types: Cigarettes    Last attempt to quit: 05/17/2007    Years since quitting: 11.0  . Smokeless tobacco: Never Used  Substance and Sexual Activity  . Alcohol use: Yes    Comment: rare  . Drug use: No  . Sexual activity: Not on file

## 2018-05-26 ENCOUNTER — Telehealth (INDEPENDENT_AMBULATORY_CARE_PROVIDER_SITE_OTHER): Payer: Self-pay | Admitting: Family

## 2018-05-26 NOTE — Telephone Encounter (Signed)
Patient called asking about how much pain medication she should be taking, and also wanted to know if she could come in and get another injection soon. CB # [phone]

## 2018-05-27 NOTE — Telephone Encounter (Signed)
Called pt and advised Kathy Kim had said to use Ibuprofen 600-800 mg bid x 2 weeks. Pt states that the tennis elbow strap is not working and that she is going out of town and Kathy Kim had discussed a possible cortisone injection and wanted to see if she can come in and have this done. appt for this coming Wednesday at 3 pm

## 2018-06-01 ENCOUNTER — Ambulatory Visit (INDEPENDENT_AMBULATORY_CARE_PROVIDER_SITE_OTHER): Payer: BLUE CROSS/BLUE SHIELD | Admitting: Orthopedic Surgery

## 2018-06-01 ENCOUNTER — Ambulatory Visit (INDEPENDENT_AMBULATORY_CARE_PROVIDER_SITE_OTHER): Payer: BLUE CROSS/BLUE SHIELD

## 2018-06-01 ENCOUNTER — Encounter (INDEPENDENT_AMBULATORY_CARE_PROVIDER_SITE_OTHER): Payer: Self-pay | Admitting: Family

## 2018-06-01 VITALS — Ht 62.0 in | Wt 156.0 lb

## 2018-06-01 DIAGNOSIS — M7711 Lateral epicondylitis, right elbow: Secondary | ICD-10-CM

## 2018-06-01 MED ORDER — METHYLPREDNISOLONE ACETATE 40 MG/ML IJ SUSP
40.0000 mg | INTRAMUSCULAR | Status: AC | PRN
  Administered 2018-06-01: 40 mg via INTRA_ARTICULAR

## 2018-06-01 MED ORDER — LIDOCAINE HCL 1 % IJ SOLN
2.0000 mL | INTRAMUSCULAR | Status: AC | PRN
  Administered 2018-06-01: 2 mL

## 2018-06-01 NOTE — Progress Notes (Signed)
xr right elbow

## 2018-06-01 NOTE — Progress Notes (Signed)
Office Visit Note   Patient: Kathy Kim           Date of Birth: 02/01/1956           MRN: [id number] Visit Date: 06/01/2018              Requested by: Eartha InchBadger, Michael C, MD [address] [place], [place] [id number] PCP: Eartha InchBadger, Michael C, MD  Chief Complaint  Patient presents with  . Right Elbow - Follow-up      HPI: Patient is a 62 year old woman with a 5-week history of lateral epicondylitis right elbow denies any trauma she states she is used anti-inflammatories she is used a tennis elbow strap without relief.  Patient states she is leaving for [place] trip tomorrow.  Assessment & Plan: Visit Diagnoses:  1. Lateral epicondylitis, right elbow     Plan: The lateral condyle was injected without complications recommended she continue with the tennis elbow strap recommended anti-inflammatories once the injection wears off.  Discussed that we could repeat the injection if necessary.  Follow-Up Instructions: Return if symptoms worsen or fail to improve.   Ortho Exam  Patient is alert, oriented, no adenopathy, well-dressed, normal affect, normal respiratory effort. Examination patient has full flexion extension supination pronation of the right elbow the radial head is nontender to palpation.  She is point tender to palpation over the lateral epicondyle.  Resisted extension of the wrist reproduces her pain.  Imaging: Xr Elbow 2 Views Right  Result Date: 06/01/2018 2 view radiographs of the right elbow shows no effusion no evidence of a fracture.  The joint space is congruent.  No images are attached to the encounter.  Labs: Lab Results  Component Value Date   REPTSTATUS 05/25/2013 FINAL 05/23/2013   CULT ESCHERICHIA COLI 05/23/2013   LABORGA ESCHERICHIA COLI 05/23/2013     Lab Results  Component Value Date   ALBUMIN 3.5 05/23/2013   ALBUMIN 3.7 09/05/2007    Body mass index is 28.53 kg/m.  Orders:  Orders Placed This Encounter  Procedures  . Medium  Joint Inj  . XR Elbow 2 Views Right   No orders of the defined types were placed in this encounter.    Procedures: Medium Joint Inj: R lateral epicondyle on 06/01/2018 4:08 PM Indications: pain and diagnostic evaluation Details: 22 G 1.5 in needle, lateral approach Medications: 2 mL lidocaine 1 %; 40 mg methylPREDNISolone acetate 40 MG/ML Outcome: tolerated well, no immediate complications Procedure, treatment alternatives, risks and benefits explained, specific risks discussed. Consent was given by the patient. Immediately prior to procedure a time out was called to verify the correct patient, procedure, equipment, support staff and site/side marked as required. Patient was prepped and draped in the usual sterile fashion.      Clinical Data: No additional findings.  ROS:  All other systems negative, except as noted in the HPI. Review of Systems  Objective: Vital Signs: Ht 5\' 2"  (1.575 m)   Wt 156 lb (70.8 kg)   BMI 28.53 kg/m   Specialty Comments:  No specialty comments available.  PMFS History: There are no active problems to display for this patient.  Past Medical History:  Diagnosis Date  . Arthritis   . De Quervain's disease (tenosynovitis)    Left  . Depression   . Frequency of urination   . GERD (gastroesophageal reflux disease)   . Headache   . Heart murmur   . Hematuria   . History of kidney stones   .  Hyperlipidemia   . Inguinal hernia    Right  . Nephrolithiasis    bilateral   . Numbness and tingling in both hands   . Ovarian cyst   . Rosacea   . Sensation of pressure in bladder area   . Sigmoid diverticulosis 06/21/2017   Noted on CT abd[facility]pelvis [facility]  . Urge urinary incontinence     Family History  Problem Relation Age of Onset  . Arthritis Mother   . Mental illness Mother   . Hypertension Mother   . Stroke Mother   . Diabetes Father   . Heart disease Father   . Hypertension Father   . Cancer Paternal Grandmother   . Hypertension  Brother     Past Surgical History:  Procedure Laterality Date  . ABDOMINOPLASTY  JAN 2014  . CARDIAC CATHETERIZATION  09-05-2007  DR BERRY   NORMAL CORONARIES AND LVF  . COLONOSCOPY    . CYSTOSCOPY W/ URETERAL STENT PLACEMENT Right 05/11/2013   Procedure: CYSTOSCOPY WITH RETROGRADE PYELOGRAM/URETERAL STENT PLACEMENT, BLADDER BIOPSY;  Surgeon: Milford Cage, MD;  Location: [facility];  Service: Urology;  Laterality: Right;  cysto, bil retrogrades, stent placement   . CYSTOSCOPY W/ URETERAL STENT PLACEMENT Bilateral 05/23/2013   Procedure: CYSTOSCOPY WITH RETROGRADE PYELOGRAM/URETERAL STENT PLACEMENT;  Surgeon: Kathi Ludwig, MD;  Location: [facility];  Service: Urology;  Laterality: Bilateral;  . CYSTOSCOPY W/ URETERAL STENT PLACEMENT Bilateral 05/31/2013   Procedure: CYSTOSCOPY WITH STENT REPLACEMENT;  Surgeon: Milford Cage, MD;  Location: [facility];  Service: Urology;  Laterality: Bilateral;  . CYSTOSCOPY WITH RETROGRADE PYELOGRAM, URETEROSCOPY AND STENT PLACEMENT Right 05/18/2013   Procedure: CYSTOSCOPY WITH RETROGRADE PYELOGRAM, URETEROSCOPY AND STENT PLACEMENT;  Surgeon: Milford Cage, MD;  Location: [facility];  Service: Urology;  Laterality: Right;  STENT EXCHANGE   . CYSTOSCOPY WITH RETROGRADE PYELOGRAM, URETEROSCOPY AND STENT PLACEMENT Bilateral 01/28/2018   Procedure: CYSTOSCOPY WITH RETROGRADE PYELOGRAM, URETEROSCOPY AND STENT PLACEMENT, STONE BASKETRY;  Surgeon: Sebastian Ache, MD;  Location: [facility];  Service: Urology;  Laterality: Bilateral;  . CYSTOSCOPY WITH RETROGRADE PYELOGRAM, URETEROSCOPY AND STENT PLACEMENT Bilateral 02/11/2018   Procedure: CYSTOSCOPY WITH RETROGRADE PYELOGRAM, URETEROSCOPY AND STENT PLACEMENT BASKET EXTRACTION;  Surgeon: Sebastian Ache, MD;  Location: [facility];  Service: Urology;  Laterality: Bilateral;  . CYSTOSCOPY/RETROGRADE/URETEROSCOPY/STONE  EXTRACTION WITH BASKET Bilateral 05/31/2013   Procedure: CYSTOSCOPY/RETROGRADE/URETEROSCOPY/STONE EXTRACTION WITH BASKET;  Surgeon: Milford Cage, MD;  Location: [facility];  Service: Urology;  Laterality: Bilateral;  . EXCISIONAL HEMORRHOIDECTOMY    . HOLMIUM LASER APPLICATION Right 05/18/2013   Procedure: HOLMIUM LASER APPLICATION;  Surgeon: Milford Cage, MD;  Location: [facility];  Service: Urology;  Laterality: Right;  . HOLMIUM LASER APPLICATION Bilateral 01/28/2018   Procedure: HOLMIUM LASER APPLICATION;  Surgeon: Sebastian Ache, MD;  Location: [facility];  Service: Urology;  Laterality: Bilateral;  . NM MYOCAR PERF WALL MOTION  07/02/2005   negative  . RIGHT URETEROSCOPIC STONE EXTRACITON  03-23-2001  . TONSILLECTOMY    . TUBAL LIGATION    . TYMPANOPLASTY  AGE 6   Social History   Occupational History  . Not on file  Tobacco Use  . Smoking status: Former Smoker    Packs/day: 0.25    Years: 15.00    Pack years: 3.75    Types: Cigarettes    Last attempt to quit: 05/17/2007    Years since quitting: 11.0  .  Smokeless tobacco: Never Used  Substance and Sexual Activity  . Alcohol use: Yes    Comment: rare  . Drug use: No  . Sexual activity: Not on file

## 2018-09-01 ENCOUNTER — Ambulatory Visit: Payer: Self-pay | Admitting: Surgery

## 2018-09-21 NOTE — Patient Instructions (Addendum)
KAILANY DINUNZIO  09/21/2018   Your procedure is scheduled on: 09-27-18   Report to [facility] Main  Entrance    Report to admitting at 10:15AM    Call this number if you have problems the morning of surgery [phone]     Remember: Do not eat food or drink liquids :After Midnight. BRUSH YOUR TEETH MORNING OF SURGERY AND RINSE YOUR MOUTH OUT, NO CHEWING GUM CANDY OR MINTS.     Take these medicines the morning of surgery with A SIP OF WATER:                                 You may not have any metal on your body including hair pins and              piercings  Do not wear jewelry, make-up, lotions, powders or perfumes, deodorant             Do not wear nail polish.  Do not shave  48 hours prior to surgery.               Do not bring valuables to the hospital. [place] IS NOT             RESPONSIBLE   FOR VALUABLES.  Contacts, dentures or bridgework may not be worn into surgery.       Patients discharged the day of surgery will not be allowed to drive home.  Name and phone number of your driver:  Special Instructions: N/A              Please read over the following fact sheets you were given: _____________________________________________________________________             [facility] - Preparing for Surgery Before surgery, you can play an important role.  Because skin is not sterile, your skin needs to be as free of germs as possible.  You can reduce the number of germs on your skin by washing with CHG (chlorahexidine gluconate) soap before surgery.  CHG is an antiseptic cleaner which kills germs and bonds with the skin to continue killing germs even after washing. Please DO NOT use if you have an allergy to CHG or antibacterial soaps.  If your skin becomes reddened/irritated stop using the CHG and inform your nurse when you arrive at Short Stay. Do not shave (including legs and underarms) for at least 48 hours prior to the first CHG shower.  You  may shave your face/neck. Please follow these instructions carefully:  1.  Shower with CHG Soap the night before surgery and the  morning of Surgery.  2.  If you choose to wash your hair, wash your hair first as usual with your  normal  shampoo.  3.  After you shampoo, rinse your hair and body thoroughly to remove the  shampoo.                           4.  Use CHG as you would any other liquid soap.  You can apply chg directly  to the skin and wash                       Gently with a scrungie or clean washcloth.  5.  Apply the CHG  Soap to your body ONLY FROM THE NECK DOWN.   Do not use on face/ open                           Wound or open sores. Avoid contact with eyes, ears mouth and genitals (private parts).                       Wash face,  Genitals (private parts) with your normal soap.             6.  Wash thoroughly, paying special attention to the area where your surgery  will be performed.  7.  Thoroughly rinse your body with warm water from the neck down.  8.  DO NOT shower/wash with your normal soap after using and rinsing off  the CHG Soap.                9.  Pat yourself dry with a clean towel.            10.  Wear clean pajamas.            11.  Place clean sheets on your bed the night of your first shower and do not  sleep with pets. Day of Surgery : Do not apply any lotions/deodorants the morning of surgery.  Please wear clean clothes to the hospital/surgery center.  FAILURE TO FOLLOW THESE INSTRUCTIONS MAY RESULT IN THE CANCELLATION OF YOUR SURGERY PATIENT SIGNATURE_________________________________  NURSE SIGNATURE__________________________________  ________________________________________________________________________

## 2018-09-22 ENCOUNTER — Encounter (HOSPITAL_COMMUNITY)
Admission: RE | Admit: 2018-09-22 | Discharge: 2018-09-22 | Disposition: A | Discharge status: Home / Self Care | Payer: BLUE CROSS/BLUE SHIELD | Source: Ambulatory Visit | Attending: Surgery | Admitting: Surgery

## 2018-09-22 ENCOUNTER — Other Ambulatory Visit: Payer: Self-pay

## 2018-09-22 ENCOUNTER — Encounter (HOSPITAL_COMMUNITY): Payer: Self-pay

## 2018-09-22 DIAGNOSIS — K439 Ventral hernia without obstruction or gangrene: Secondary | ICD-10-CM | POA: Insufficient documentation

## 2018-09-22 DIAGNOSIS — Z01812 Encounter for preprocedural laboratory examination: Secondary | ICD-10-CM | POA: Diagnosis present

## 2018-09-22 HISTORY — DX: Family history of other specified conditions: Z84.89

## 2018-09-22 LAB — CBC
HEMATOCRIT: 43 % (ref 36.0–46.0)
HEMOGLOBIN: 14.4 g/dL (ref 12.0–15.0)
MCH: 29.7 pg (ref 26.0–34.0)
MCHC: 33.5 g/dL (ref 30.0–36.0)
MCV: 88.7 fL (ref 78.0–100.0)
Platelets: 290 10*3/uL (ref 150–400)
RBC: 4.85 MIL/uL (ref 3.87–5.11)
RDW: 13.1 % (ref 11.5–15.5)
WBC: 6.4 10*3/uL (ref 4.0–10.5)

## 2018-09-22 LAB — BASIC METABOLIC PANEL
ANION GAP: 12 (ref 5–15)
BUN: 16 mg/dL (ref 8–23)
CO2: 26 mmol/L (ref 22–32)
Calcium: 8.8 mg/dL — ABNORMAL LOW (ref 8.9–10.3)
Chloride: 104 mmol/L (ref 98–111)
Creatinine, Ser: 0.68 mg/dL (ref 0.44–1.00)
GFR calc Af Amer: >60 mL/min (ref >60)
GFR calc non Af Amer: >60 mL/min (ref >60)
GLUCOSE: 103 mg/dL — AB (ref 70–99)
POTASSIUM: 3.3 mmol/L — AB (ref 3.5–5.1)
Sodium: 142 mmol/L (ref 135–145)

## 2018-09-25 ENCOUNTER — Encounter (HOSPITAL_COMMUNITY): Payer: Self-pay | Admitting: Surgery

## 2018-09-25 DIAGNOSIS — K439 Ventral hernia without obstruction or gangrene: Secondary | ICD-10-CM | POA: Diagnosis present

## 2018-09-25 NOTE — H&P (Signed)
General Surgery [facility]  Kathy Kim DOB: [birth year] Widowed / Language: English / Race: White Female   History of Present Illness  The patient is a 62 year old female who presents with a spigelian hernia.  CC: follow up Spigelian hernia, hemorrhoids  Patient returns for follow-up of a right sided spigelian hernia as well as complaints of thrombosed external hemorrhoid. Patient continues to note the hernia in the right abdominal wall occurring intermittently. It has always been reducible. It is mildly painful when it prolapses. She has had no signs or symptoms of obstruction. Patient had a recent thrombosed external hemorrhoid by her description. This is improving over the past 2 weeks but still causing her some symptoms. She does not wish to be examined today but wondered if we had any topical creams to apply to the hemorrhoids for comfort and to speed healing. She denies any significant bleeding per rectum.   Allergies Codeine and Related  Itching. Tetracyclines & Related  Hives. Allergies Reconciled   Medication History Lidocaine (5% Ointment, 1 External three times daily, as needed, Taken starting 03/03/2018) Active. Venlafaxine HCl ER (75MG  Capsule ER 24HR, Oral) Active. Rosuvastatin Calcium (10MG  Tablet, Oral) Active. Sulfamethoxazole-Trimethoprim (400-80MG  Tablet, Oral) Active. Medications Reconciled  Vitals  Weight: 155.6 lb Height: 61in Body Surface Area: 1.7 m Body Mass Index: 29.4 kg/m  Temp.: 97.63F  Pulse: 91 (Regular)  BP: 110/72 (Sitting, Left Arm, Standard)   Physical Exam  See vital signs recorded above  GENERAL APPEARANCE Development: normal Nutritional status: normal Gross deformities: none  SKIN Rash, lesions, ulcers: none Induration, erythema: none Nodules: none palpable  EYES Conjunctiva and lids: normal Pupils: equal and reactive Iris: normal bilaterally  EARS, NOSE, MOUTH,  THROAT External ears: no lesion or deformity External nose: no lesion or deformity Hearing: grossly normal Lips: no lesion or deformity Dentition: normal for age Oral mucosa: moist  NECK Symmetric: yes Trachea: midline Thyroid: no palpable nodules in the thyroid bed  CHEST Respiratory effort: normal Retraction or accessory muscle use: no Breath sounds: normal bilaterally Rales, rhonchi, wheeze: none  CARDIOVASCULAR Auscultation: regular rhythm, normal rate Murmurs: none Pulses: carotid and radial pulse 2+ palpable Lower extremity edema: none Lower extremity varicosities: none  ABDOMEN Distension: none Masses: none palpable Tenderness: none Hepatosplenomegaly: not present Hernia: not present Well-healed lower transverse abdominal incision consistent with abdominoplasty. Patient is examined in a standing position and with cough and Valsalva I am unable to reproduce the spaghetti and hernia on the right side.  MUSCULOSKELETAL Station and gait: normal Digits and nails: no clubbing or cyanosis Muscle strength: grossly normal all extremities Range of motion: grossly normal all extremities Deformity: none  LYMPHATIC Cervical: none palpable Supraclavicular: none palpable  PSYCHIATRIC Oriented to person, place, and time: yes Mood and affect: normal for situation Judgment and insight: appropriate for situation    Assessment & Plan  SPIGELIAN HERNIA (K43.9) HEMORRHOIDS, EXTERNAL (K64.4)  Follow Up - Call [facility] office after tests / studies doneto discuss further plans  Patient would like to discuss surgery for repair of right sided spigelian hernia. She has been unable to demonstrate the hernia during one of her office visits. We discussed a strategy for surgical management. She would like to wait until after a family vacation to [place] later this summer. I have recommended proceeding with surgery at a convenient time for the patient. If we are able to demonstrate  the hernia immediately prior to surgery, then we will be able to mark the  site and perform an open repair with mesh as an outpatient procedure. If we are unable to identify the hernia prior to surgery, then I would recommend diagnostic laparoscopy with marking of the hernia site followed by open repair. Patient understands the strategy and is in agreement. However she would like to wait until after her trip. We discussed the procedure, the use of mesh, and the postoperative recovery and restrictions on her activities. She understands.  The risks and benefits of the procedure have been discussed at length with the patient.  The patient understands the proposed procedure, potential alternative treatments, and the course of recovery to be expected.  All of the patient's questions have been answered at this time.  The patient wishes to proceed with surgery.   Darnell Level, MD [facility] Surgery Office: [phone]

## 2018-09-27 ENCOUNTER — Ambulatory Visit (HOSPITAL_COMMUNITY): Payer: BLUE CROSS/BLUE SHIELD | Admitting: Registered Nurse

## 2018-09-27 ENCOUNTER — Encounter (HOSPITAL_COMMUNITY)
Admission: RE | Disposition: A | Discharge status: Home / Self Care | Payer: Self-pay | Source: Ambulatory Visit | Attending: Surgery

## 2018-09-27 ENCOUNTER — Encounter (HOSPITAL_COMMUNITY): Payer: Self-pay | Admitting: *Deleted

## 2018-09-27 ENCOUNTER — Ambulatory Visit (HOSPITAL_COMMUNITY)
Admission: RE | Admit: 2018-09-27 | Discharge: 2018-09-27 | Disposition: A | Discharge status: Home / Self Care | Payer: BLUE CROSS/BLUE SHIELD | Source: Ambulatory Visit | Attending: Surgery | Admitting: Surgery

## 2018-09-27 DIAGNOSIS — Z79899 Other long term (current) drug therapy: Secondary | ICD-10-CM | POA: Diagnosis not present

## 2018-09-27 DIAGNOSIS — Z881 Allergy status to other antibiotic agents status: Secondary | ICD-10-CM | POA: Diagnosis not present

## 2018-09-27 DIAGNOSIS — K644 Residual hemorrhoidal skin tags: Secondary | ICD-10-CM | POA: Insufficient documentation

## 2018-09-27 DIAGNOSIS — K219 Gastro-esophageal reflux disease without esophagitis: Secondary | ICD-10-CM | POA: Diagnosis not present

## 2018-09-27 DIAGNOSIS — F329 Major depressive disorder, single episode, unspecified: Secondary | ICD-10-CM | POA: Insufficient documentation

## 2018-09-27 DIAGNOSIS — G629 Polyneuropathy, unspecified: Secondary | ICD-10-CM | POA: Diagnosis not present

## 2018-09-27 DIAGNOSIS — Z885 Allergy status to narcotic agent status: Secondary | ICD-10-CM | POA: Diagnosis not present

## 2018-09-27 DIAGNOSIS — K439 Ventral hernia without obstruction or gangrene: Secondary | ICD-10-CM | POA: Insufficient documentation

## 2018-09-27 DIAGNOSIS — Z87891 Personal history of nicotine dependence: Secondary | ICD-10-CM | POA: Insufficient documentation

## 2018-09-27 DIAGNOSIS — M1990 Unspecified osteoarthritis, unspecified site: Secondary | ICD-10-CM | POA: Insufficient documentation

## 2018-09-27 HISTORY — PX: VENTRAL HERNIA REPAIR: SHX424

## 2018-09-27 SURGERY — REPAIR, HERNIA, VENTRAL, LAPAROSCOPIC
Anesthesia: General | Site: Abdomen

## 2018-09-27 MED ORDER — OXYCODONE HCL 5 MG PO TABS: 5.0000 mg | ORAL_TABLET | Freq: Once | ORAL | Status: DC | PRN

## 2018-09-27 MED ORDER — LIDOCAINE 2% (20 MG/ML) 5 ML SYRINGE
INTRAMUSCULAR | Status: DC | PRN
  Administered 2018-09-27: 60 mg via INTRAVENOUS

## 2018-09-27 MED ORDER — SCOPOLAMINE 1 MG/3DAYS TD PT72
MEDICATED_PATCH | TRANSDERMAL | Status: AC
  Filled 2018-09-27: qty 1

## 2018-09-27 MED ORDER — FENTANYL CITRATE (PF) 100 MCG/2ML IJ SOLN
INTRAMUSCULAR | Status: DC | PRN
  Administered 2018-09-27 (×2): 50 ug via INTRAVENOUS

## 2018-09-27 MED ORDER — EPHEDRINE 5 MG/ML INJ
INTRAVENOUS | Status: AC
  Filled 2018-09-27: qty 10

## 2018-09-27 MED ORDER — GLYCOPYRROLATE PF 0.2 MG/ML IJ SOSY
PREFILLED_SYRINGE | INTRAMUSCULAR | Status: AC
  Filled 2018-09-27: qty 1

## 2018-09-27 MED ORDER — CEFAZOLIN SODIUM-DEXTROSE 2-4 GM/100ML-% IV SOLN
INTRAVENOUS | Status: AC
  Filled 2018-09-27: qty 100

## 2018-09-27 MED ORDER — LACTATED RINGERS IV SOLN: INTRAVENOUS | Status: DC

## 2018-09-27 MED ORDER — METOCLOPRAMIDE HCL 5 MG/ML IJ SOLN: 10.0000 mg | Freq: Once | INTRAMUSCULAR | Status: DC | PRN

## 2018-09-27 MED ORDER — BUPIVACAINE HCL (PF) 0.25 % IJ SOLN
INTRAMUSCULAR | Status: AC
  Filled 2018-09-27: qty 30

## 2018-09-27 MED ORDER — SUGAMMADEX SODIUM 500 MG/5ML IV SOLN
INTRAVENOUS | Status: DC | PRN
  Administered 2018-09-27: 300 mg via INTRAVENOUS

## 2018-09-27 MED ORDER — PROPOFOL 10 MG/ML IV BOLUS
INTRAVENOUS | Status: AC
  Filled 2018-09-27: qty 20

## 2018-09-27 MED ORDER — CHLORHEXIDINE GLUCONATE CLOTH 2 % EX PADS: 6.0000 | MEDICATED_PAD | Freq: Once | CUTANEOUS | Status: DC

## 2018-09-27 MED ORDER — FENTANYL CITRATE (PF) 100 MCG/2ML IJ SOLN: 25.0000 ug | INTRAMUSCULAR | Status: DC | PRN

## 2018-09-27 MED ORDER — DEXAMETHASONE SODIUM PHOSPHATE 10 MG/ML IJ SOLN
INTRAMUSCULAR | Status: AC
  Filled 2018-09-27: qty 1

## 2018-09-27 MED ORDER — LIDOCAINE HCL (CARDIAC) PF 100 MG/5ML IV SOSY
PREFILLED_SYRINGE | INTRAVENOUS | Status: AC
  Filled 2018-09-27: qty 5

## 2018-09-27 MED ORDER — OXYCODONE HCL 5 MG/5ML PO SOLN: 5.0000 mg | Freq: Once | ORAL | Status: DC | PRN

## 2018-09-27 MED ORDER — FENTANYL CITRATE (PF) 100 MCG/2ML IJ SOLN
INTRAMUSCULAR | Status: AC
  Filled 2018-09-27: qty 2

## 2018-09-27 MED ORDER — MIDAZOLAM HCL 5 MG/5ML IJ SOLN
INTRAMUSCULAR | Status: DC | PRN
  Administered 2018-09-27: 2 mg via INTRAVENOUS

## 2018-09-27 MED ORDER — LACTATED RINGERS IV SOLN
INTRAVENOUS | Status: DC
  Administered 2018-09-27: 11:00:00 via INTRAVENOUS

## 2018-09-27 MED ORDER — SCOPOLAMINE 1 MG/3DAYS TD PT72
MEDICATED_PATCH | TRANSDERMAL | Status: DC | PRN
  Administered 2018-09-27: 1 via TRANSDERMAL

## 2018-09-27 MED ORDER — SUGAMMADEX SODIUM 500 MG/5ML IV SOLN
INTRAVENOUS | Status: AC
  Filled 2018-09-27: qty 5

## 2018-09-27 MED ORDER — ONDANSETRON HCL 4 MG/2ML IJ SOLN
INTRAMUSCULAR | Status: DC | PRN
  Administered 2018-09-27: 4 mg via INTRAVENOUS

## 2018-09-27 MED ORDER — EPHEDRINE SULFATE-NACL 50-0.9 MG/10ML-% IV SOSY
PREFILLED_SYRINGE | INTRAVENOUS | Status: DC | PRN
  Administered 2018-09-27: 5 mg via INTRAVENOUS

## 2018-09-27 MED ORDER — ONDANSETRON HCL 4 MG/2ML IJ SOLN
INTRAMUSCULAR | Status: AC
  Filled 2018-09-27: qty 2

## 2018-09-27 MED ORDER — MEPERIDINE HCL 50 MG/ML IJ SOLN: 6.2500 mg | INTRAMUSCULAR | Status: DC | PRN

## 2018-09-27 MED ORDER — CEFAZOLIN SODIUM-DEXTROSE 2-4 GM/100ML-% IV SOLN
2.0000 g | INTRAVENOUS | Status: AC
  Administered 2018-09-27: 2 g via INTRAVENOUS

## 2018-09-27 MED ORDER — TRAMADOL HCL 50 MG PO TABS: 50.0000 mg | ORAL_TABLET | Freq: Four times a day (QID) | ORAL | 0 refills | Status: DC | PRN

## 2018-09-27 MED ORDER — ROCURONIUM BROMIDE 100 MG/10ML IV SOLN
INTRAVENOUS | Status: AC
  Filled 2018-09-27: qty 1

## 2018-09-27 MED ORDER — ROCURONIUM BROMIDE 10 MG/ML (PF) SYRINGE
PREFILLED_SYRINGE | INTRAVENOUS | Status: DC | PRN
  Administered 2018-09-27: 50 mg via INTRAVENOUS

## 2018-09-27 MED ORDER — DEXAMETHASONE SODIUM PHOSPHATE 10 MG/ML IJ SOLN
INTRAMUSCULAR | Status: DC | PRN
  Administered 2018-09-27: 10 mg via INTRAVENOUS

## 2018-09-27 MED ORDER — PROPOFOL 10 MG/ML IV BOLUS
INTRAVENOUS | Status: DC | PRN
  Administered 2018-09-27: 150 mg via INTRAVENOUS

## 2018-09-27 MED ORDER — MIDAZOLAM HCL 2 MG/2ML IJ SOLN
INTRAMUSCULAR | Status: AC
  Filled 2018-09-27: qty 2

## 2018-09-27 MED ORDER — GLYCOPYRROLATE PF 0.2 MG/ML IJ SOSY
PREFILLED_SYRINGE | INTRAMUSCULAR | Status: DC | PRN
  Administered 2018-09-27: .2 mg via INTRAVENOUS

## 2018-09-27 MED ORDER — BUPIVACAINE HCL (PF) 0.25 % IJ SOLN
INTRAMUSCULAR | Status: DC | PRN
  Administered 2018-09-27: 20 mL

## 2018-09-27 SURGICAL SUPPLY — 34 items
ADH SKN CLS APL DERMABOND .7 (GAUZE/BANDAGES/DRESSINGS) ×2
BINDER ABDOMINAL 12 ML 46-62 (SOFTGOODS) IMPLANT
CHLORAPREP W/TINT 26ML (MISCELLANEOUS) ×4 IMPLANT
CLOSURE WOUND 1/2 X4 (GAUZE/BANDAGES/DRESSINGS)
COVER SURGICAL LIGHT HANDLE (MISCELLANEOUS) ×4 IMPLANT
DECANTER SPIKE VIAL GLASS SM (MISCELLANEOUS) IMPLANT
DERMABOND ADVANCED (GAUZE/BANDAGES/DRESSINGS) ×2
DERMABOND ADVANCED .7 DNX12 (GAUZE/BANDAGES/DRESSINGS) ×2 IMPLANT
DEVICE SECURE STRAP 25 ABSORB (INSTRUMENTS) IMPLANT
DEVICE TROCAR PUNCTURE CLOSURE (ENDOMECHANICALS) IMPLANT
DRAPE INCISE IOBAN 66X45 STRL (DRAPES) IMPLANT
DRAPE UTILITY XL STRL (DRAPES) IMPLANT
ELECT REM PT RETURN 15FT ADLT (MISCELLANEOUS) IMPLANT
GLOVE SURG ORTHO 8.0 STRL STRW (GLOVE) ×4 IMPLANT
GOWN STRL REUS W/TWL XL LVL3 (GOWN DISPOSABLE) ×16 IMPLANT
KIT BASIN OR (CUSTOM PROCEDURE TRAY) ×4 IMPLANT
MARKER SKIN DUAL TIP RULER LAB (MISCELLANEOUS) ×4 IMPLANT
NEEDLE SPNL 22GX3.5 QUINCKE BK (NEEDLE) ×4 IMPLANT
PAD POSITIONING PINK XL (MISCELLANEOUS) IMPLANT
POSITIONER SURGICAL ARM (MISCELLANEOUS) IMPLANT
SCISSORS LAP 5X35 DISP (ENDOMECHANICALS) IMPLANT
SET IRRIG TUBING LAPAROSCOPIC (IRRIGATION / IRRIGATOR) IMPLANT
SHEARS HARMONIC ACE PLUS 36CM (ENDOMECHANICALS) IMPLANT
SOLUTION ANTI FOG 6CC (MISCELLANEOUS) IMPLANT
STRIP CLOSURE SKIN 1/2X4 (GAUZE/BANDAGES/DRESSINGS) IMPLANT
SUT NOVA NAB DX-16 0-1 5-0 T12 (SUTURE) IMPLANT
TACKER 5MM HERNIA 3.5CML NAB (ENDOMECHANICALS) IMPLANT
TAPE CLOTH 4X10 WHT NS (GAUZE/BANDAGES/DRESSINGS) IMPLANT
TOWEL OR 17X26 10 PK STRL BLUE (TOWEL DISPOSABLE) ×4 IMPLANT
TOWEL OR NON WOVEN STRL DISP B (DISPOSABLE) ×4 IMPLANT
TRAY FOLEY MTR SLVR 16FR STAT (SET/KITS/TRAYS/PACK) IMPLANT
TRAY LAPAROSCOPIC (CUSTOM PROCEDURE TRAY) ×4 IMPLANT
TROCAR BLADELESS OPT 5 100 (ENDOMECHANICALS) ×8 IMPLANT
TUBING INSUF HEATED (TUBING) ×4 IMPLANT

## 2018-09-27 NOTE — Anesthesia Postprocedure Evaluation (Signed)
Anesthesia Post Note  Patient: Kathy Kim  Procedure(s) Performed: DIAGNOSTIC LAPAROSCOPY (N/A Abdomen)     Patient location during evaluation: PACU Anesthesia Type: General Level of consciousness: awake and alert Pain management: pain level controlled Vital Signs Assessment: post-procedure vital signs reviewed and stable Respiratory status: spontaneous breathing, nonlabored ventilation, respiratory function stable and patient connected to nasal cannula oxygen Cardiovascular status: blood pressure returned to baseline and stable Postop Assessment: no apparent nausea or vomiting Anesthetic complications: no    Last Vitals:  Vitals:   09/27/18 1330 09/27/18 1406  BP: 98/66 96/68  Pulse: 64 66  Resp: 18 15  Temp:  37 C  SpO2: 100% 94%    Last Pain:  Vitals:   09/27/18 1330  TempSrc:   PainSc: 0-No pain                 Phillips Grout

## 2018-09-27 NOTE — Op Note (Signed)
Operative Note  Pre-operative Diagnosis: Rule out spigelian hernia, right abdominal wall  Post-operative Diagnosis: Normal diagnostic laparoscopy, no evidence of hernia  Surgeon:  Darnell Level, MD  Assistant: None   Procedure: Diagnostic laparoscopy  Anesthesia: General  Estimated Blood Loss: Minimal  Drains: None         Specimen: None  Indications: Patient is a 62 year old female who presents with a clinical history of an intermittent bulge in the right mid abdominal wall consistent with spigelian hernia.  This has not been demonstrated on physical examination.  Therefore the patient comes to surgery for diagnostic laparoscopy to confirm the diagnosis and localized the location of the hernia.  Procedure Details:  The patient was seen in the pre-op holding area. The risks, benefits, complications, treatment options, and expected outcomes were previously discussed with the patient. The patient agreed with the proposed plan and has signed the informed consent form.  The patient was brought to the operating room by the surgical team, identified as Kathy Kim and the procedure verified. A "time out" was completed and the above information confirmed.  Patient was brought to the operating room placed in supine position on the operating room table.  Following administration of general anesthesia the patient is positioned and then prepped and draped in the usual aseptic fashion.  After ascertaining that an adequate level of anesthesia been achieved, an incision is made near the left costal margin.  Using a 5 mm Optiview trocar the laparoscope was introduced into the peritoneal cavity without complication.  Abdomen is insufflated with carbon dioxide.  Laparoscope was introduced in the abdomen explored.  A second 5 mm port was placed in the right upper quadrant under direct vision.  Using an instrument to explore the anterior abdominal wall and attempt is made to identify a fascial defect.  No  such defect is identified.  In the area in question along the lateral aspect of the rectus muscle on the right there is an intact fascia with no sign of fascial defect or hernia sac.  This is palpated with the instrument along the length of the rectus musculature.  Pelvis is examined.  Inguinal region is examined bilaterally.  There is a small indentation in the peritoneum in the right groin region.  This is explored with an instrument and again no sign of indirect inguinal hernias found.  Representative photographs are taken for the medical record.  Ports were removed under direct vision and pneumoperitoneum is released.  Port sites are anesthetized with local anesthetic.  Skin incisions are closed with interrupted 4-0 Monocryl subcuticular sutures.  Wounds are washed and dried and Dermabond is applied as dressing.  Patient is awakened from anesthesia and brought to the recovery room in stable condition.   Darnell Level, MD [facility] Office: [phone]

## 2018-09-27 NOTE — Transfer of Care (Signed)
Immediate Anesthesia Transfer of Care Note  Patient: Kathy Kim  Procedure(s) Performed: DIAGNOSTIC LAPAROSCOPY (N/A Abdomen)  Patient Location: PACU  Anesthesia Type:General  Level of Consciousness: awake, alert , oriented and patient cooperative  Airway & Oxygen Therapy: Patient Spontanous Breathing and Patient connected to face mask oxygen  Post-op Assessment: Report given to RN, Post -op Vital signs reviewed and stable and Patient moving all extremities  Post vital signs: Reviewed and stable  Last Vitals:  Vitals Value Taken Time  BP 100/70 09/27/2018  1:19 PM  Temp    Pulse 82 09/27/2018  1:21 PM  Resp 18 09/27/2018  1:21 PM  SpO2 100 % 09/27/2018  1:21 PM  Vitals shown include unvalidated device data.  Last Pain:  Vitals:   09/27/18 1107  TempSrc:   PainSc: 0-No pain         Complications: No apparent anesthesia complications

## 2018-09-27 NOTE — Anesthesia Preprocedure Evaluation (Signed)
Anesthesia Evaluation  Patient identified by MRN, date of birth, ID band Patient awake    Reviewed: Allergy & Precautions, H&P , NPO status , Patient's Chart, lab work & pertinent test results  Airway Mallampati: II  TM Distance: >3 FB Neck ROM: Full    Dental no notable dental hx. (+) Dental Advisory Given, Teeth Intact   Pulmonary former smoker,    Pulmonary exam normal breath sounds clear to auscultation       Cardiovascular negative cardio ROS Normal cardiovascular exam Rhythm:Regular Rate:Normal     Neuro/Psych Depression Peripheral neuropathy b/l hands    GI/Hepatic Neg liver ROS, GERD  Controlled,  Endo/Other  negative endocrine ROS  Renal/GU Nephrolithiaisis     Musculoskeletal  (+) Arthritis ,   Abdominal   Peds  Hematology negative hematology ROS (+)   Anesthesia Other Findings   Reproductive/Obstetrics                             Anesthesia Physical  Anesthesia Plan  ASA: II  Anesthesia Plan: General   Post-op Pain Management:    Induction: Intravenous  PONV Risk Score and Plan: 4 or greater and Ondansetron, Dexamethasone, Midazolam, Treatment may vary due to age or medical condition and Scopolamine patch - Pre-op  Airway Management Planned: Oral ETT  Additional Equipment: None  Intra-op Plan:   Post-operative Plan: Extubation in OR  Informed Consent: I have reviewed the patients History and Physical, chart, labs and discussed the procedure including the risks, benefits and alternatives for the proposed anesthesia with the patient or authorized representative who has indicated his/her understanding and acceptance.   Dental advisory given  Plan Discussed with: CRNA  Anesthesia Plan Comments:         Anesthesia Quick Evaluation

## 2018-09-27 NOTE — Anesthesia Procedure Notes (Signed)
Procedure Name: Intubation Date/Time: 09/27/2018 12:30 PM Performed by: Victoriano Lain, CRNA Pre-anesthesia Checklist: Patient identified, Emergency Drugs available, Suction available, Patient being monitored and Timeout performed Patient Re-evaluated:Patient Re-evaluated prior to induction Oxygen Delivery Method: Circle system utilized Preoxygenation: Pre-oxygenation with 100% oxygen Induction Type: IV induction Ventilation: Mask ventilation without difficulty Laryngoscope Size: Miller and 2 Grade View: Grade III Tube type: Oral Tube size: 7.5 mm Number of attempts: 3 Airway Equipment and Method: Bougie stylet Placement Confirmation: ETT inserted through vocal cords under direct vision,  positive ETCO2 and breath sounds checked- equal and bilateral Secured at: 20 cm Tube secured with: Tape Dental Injury: Teeth and Oropharynx as per pre-operative assessment  Comments: Easy mask. DL x 1 with MAC 4 by CRNA. Unable to see vocal cords. VSS. DL x 1 by Dr. Marcell Barlow with a MAC 3. Grade 4 view.  Easy mask. DL x 1 by Dr. Marcell Barlow with a Mil 2. Grade 3 view. ATOI. BBS =, + ETCO2.

## 2018-09-27 NOTE — Interval H&P Note (Signed)
History and Physical Interval Note:  09/27/2018 11:58 AM  Kathy Kim  has presented today for surgery, with the diagnosis of SPIGELIEN HERNIA.  The various methods of treatment have been discussed with the patient and family. After consideration of risks, benefits and other options for treatment, the patient has consented to    Procedure(s): POSSIBLE LAPAROSCOPIC OPEN  SPIGELIAN HERNIA REPAIR (N/A) INSERTION OF MESH (N/A) as a surgical intervention .    The patient's history has been reviewed, patient examined, no change in status, stable for surgery.  I have reviewed the patient's chart and labs.  Questions were answered to the patient's satisfaction.    Darnell Level, MD [facility] Surgery Office: [phone]    Kathy Kim Judie Petit

## 2018-09-28 ENCOUNTER — Encounter (HOSPITAL_COMMUNITY): Payer: Self-pay | Admitting: Surgery

## 2020-03-24 ENCOUNTER — Other Ambulatory Visit: Payer: Self-pay

## 2020-03-24 ENCOUNTER — Encounter (HOSPITAL_BASED_OUTPATIENT_CLINIC_OR_DEPARTMENT_OTHER): Payer: Self-pay | Admitting: Emergency Medicine

## 2020-03-24 ENCOUNTER — Emergency Department (HOSPITAL_BASED_OUTPATIENT_CLINIC_OR_DEPARTMENT_OTHER)
Admission: EM | Admit: 2020-03-24 | Discharge: 2020-03-24 | Disposition: A | Discharge status: Home / Self Care | Payer: No Typology Code available for payment source | Attending: Emergency Medicine | Admitting: Emergency Medicine

## 2020-03-24 DIAGNOSIS — Z87891 Personal history of nicotine dependence: Secondary | ICD-10-CM | POA: Insufficient documentation

## 2020-03-24 DIAGNOSIS — Z79899 Other long term (current) drug therapy: Secondary | ICD-10-CM | POA: Insufficient documentation

## 2020-03-24 DIAGNOSIS — R05 Cough: Secondary | ICD-10-CM | POA: Insufficient documentation

## 2020-03-24 DIAGNOSIS — R42 Dizziness and giddiness: Secondary | ICD-10-CM | POA: Diagnosis not present

## 2020-03-24 LAB — URINALYSIS, ROUTINE W REFLEX MICROSCOPIC
Bilirubin Urine: NEGATIVE
Glucose, UA: NEGATIVE mg/dL
Hgb urine dipstick: NEGATIVE
Ketones, ur: NEGATIVE mg/dL
Nitrite: NEGATIVE
Protein, ur: NEGATIVE mg/dL
Specific Gravity, Urine: 1.02 (ref 1.005–1.030)
pH: 6 (ref 5.0–8.0)

## 2020-03-24 LAB — BASIC METABOLIC PANEL
Anion gap: 12 (ref 5–15)
BUN: 19 mg/dL (ref 8–23)
CO2: 24 mmol/L (ref 22–32)
Calcium: 8.9 mg/dL (ref 8.9–10.3)
Chloride: 102 mmol/L (ref 98–111)
Creatinine, Ser: 0.75 mg/dL (ref 0.44–1.00)
GFR calc Af Amer: >60 mL/min (ref >60)
GFR calc non Af Amer: >60 mL/min (ref >60)
Glucose, Bld: 150 mg/dL — ABNORMAL HIGH (ref 70–99)
Potassium: 3.1 mmol/L — ABNORMAL LOW (ref 3.5–5.1)
Sodium: 138 mmol/L (ref 135–145)

## 2020-03-24 LAB — CBG MONITORING, ED: Glucose-Capillary: 133 mg/dL — ABNORMAL HIGH (ref 70–99)

## 2020-03-24 LAB — CBC
HCT: 43.4 % (ref 36.0–46.0)
Hemoglobin: 14.2 g/dL (ref 12.0–15.0)
MCH: 29.2 pg (ref 26.0–34.0)
MCHC: 32.7 g/dL (ref 30.0–36.0)
MCV: 89.1 fL (ref 80.0–100.0)
Platelets: 262 10*3/uL (ref 150–400)
RBC: 4.87 MIL/uL (ref 3.87–5.11)
RDW: 13.2 % (ref 11.5–15.5)
WBC: 5.3 10*3/uL (ref 4.0–10.5)
nRBC: 0 % (ref 0.0–0.2)

## 2020-03-24 LAB — URINALYSIS, MICROSCOPIC (REFLEX)

## 2020-03-24 MED ORDER — MECLIZINE HCL 25 MG PO TABS: 25.0000 mg | ORAL_TABLET | Freq: Three times a day (TID) | ORAL | 0 refills | Status: DC | PRN

## 2020-03-24 MED ORDER — DIAZEPAM 5 MG PO TABS
5.0000 mg | ORAL_TABLET | Freq: Once | ORAL | Status: AC
  Administered 2020-03-24: 5 mg via ORAL
  Filled 2020-03-24: qty 1

## 2020-03-24 MED ORDER — ONDANSETRON HCL 4 MG/2ML IJ SOLN
4.0000 mg | Freq: Once | INTRAMUSCULAR | Status: AC
  Administered 2020-03-24: 4 mg via INTRAVENOUS
  Filled 2020-03-24: qty 2

## 2020-03-24 MED ORDER — ONDANSETRON 4 MG PO TBDP: 4.0000 mg | ORAL_TABLET | Freq: Four times a day (QID) | ORAL | 0 refills | Status: DC | PRN

## 2020-03-24 MED ORDER — MECLIZINE HCL 25 MG PO TABS
50.0000 mg | ORAL_TABLET | Freq: Once | ORAL | Status: AC
  Administered 2020-03-24: 50 mg via ORAL
  Filled 2020-03-24: qty 2

## 2020-03-24 MED ORDER — POTASSIUM CHLORIDE CRYS ER 20 MEQ PO TBCR
40.0000 meq | EXTENDED_RELEASE_TABLET | Freq: Once | ORAL | Status: AC
  Administered 2020-03-24: 06:00:00 40 meq via ORAL
  Filled 2020-03-24: qty 2

## 2020-03-24 MED ORDER — SODIUM CHLORIDE 0.9 % IV SOLN: Freq: Once | INTRAVENOUS | Status: DC

## 2020-03-24 MED ORDER — SODIUM CHLORIDE 0.9 % IV BOLUS (SEPSIS)
1000.0000 mL | Freq: Once | INTRAVENOUS | Status: AC
  Administered 2020-03-24: 1000 mL via INTRAVENOUS

## 2020-03-24 NOTE — ED Provider Notes (Signed)
Care was taken over from Dr. Elesa Massed.  Patient presents with vertigo.  On discussion with the patient and exam of the previous position, the etiology is suggestive of peripheral vertigo.  It started suddenly with head movement while she was getting out of bed to go the bathroom.  She has no focal neurologic deficits.  No current headache.  Her symptoms have improved with treatment in the ED.  She now is able to ambulate without ataxia.  Her dizziness has improved.  She was discharged home in good condition.  Return precautions were given.  Discharge prescriptions and instructions were given per Dr. Elesa Massed.   Rolan Bucco, MD 03/24/20 [phone]

## 2020-03-24 NOTE — ED Provider Notes (Signed)
TIME SEEN: 4:36 AM  CHIEF COMPLAINT: Vertigo  HPI: Patient is a 64 year old female who presents to the emergency department with vertigo.  States she went to bed last night around 10:30 PM in her normal state of health.  Woke up at 3 AM to go the bathroom and when standing upright felt like her "equilibrium was off".  Feels like the room is spinning.  She is having nausea but no vomiting.  No recent fevers, chills, vomiting or diarrhea.  States she feels like both her hands are tingling.  No numbness or focal weakness.  No history of previous stroke.  No head injury.  Not on blood thinners.  Has been having intermittent headaches but this is not abnormal for her.  Has never had vertigo before.  No tinnitus, hearing loss, ear pain.  Symptoms worse with standing upright and moving.  Has been able to ambulate.  No falls.  Has chronic cough since having Covid in December.  States she is scheduled to see a pulmonologist this week.  ROS: See HPI, limited as patient is a poor historian  Constitutional: no fever  Eyes: no drainage  ENT: no runny nose   Cardiovascular:  no chest pain  Resp: no SOB  GI: no vomiting GU: no dysuria Integumentary: no rash  Allergy: no hives  Musculoskeletal: no leg swelling  Neurological: no slurred speech ROS otherwise negative  PAST MEDICAL HISTORY/PAST SURGICAL HISTORY:  Past Medical History:  Diagnosis Date  . Arthritis   . De Quervain's disease (tenosynovitis)    Left  . Depression   . Family history of adverse reaction to anesthesia    grandmother (deceased) was slow to wake   . Frequency of urination   . GERD (gastroesophageal reflux disease)   . Headache   . Heart murmur    was told this as a teenager when she had physical to try out for cheerleading;  f/u for eval with caridiologist who did a cath d/t family history of heart disease; was told cath was normal   . Hematuria   . History of kidney stones   . Hyperlipidemia   . Inguinal hernia    Right   . Nephrolithiasis    bilateral   . Numbness and tingling in both hands   . Ovarian cyst   . Rosacea   . Sensation of pressure in bladder area   . Sigmoid diverticulosis 06/21/2017   Noted on CT abd[facility]pelvis at [facility]  . Urge urinary incontinence     MEDICATIONS:  Prior to Admission medications   Medication Sig Start Date End Date Taking? Authorizing Provider  atorvastatin (LIPITOR) 20 MG tablet Take 20 mg by mouth daily.     [provider]  cephALEXin (KEFLEX) 500 MG capsule Take 1 capsule (500 mg total) by mouth 2 (two) times daily. X 3 days to prevent infection with tethered stent in place. Patient not taking: Reported on 09/19/2018 02/11/18   Alexis Frock, MD  ibuprofen (ADVIL,MOTRIN) 200 MG tablet Take 400 mg by mouth every 4 (four) hours as needed for moderate pain.     [provider]  indapamide (LOZOL) 1.25 MG tablet Take 1.25 mg by mouth daily.     [provider]  ketorolac (TORADOL) 10 MG tablet Take 1 tablet (10 mg total) by mouth every 6 (six) hours as needed for moderate pain. Or stent discomfort Patient not taking: Reported on 09/19/2018 02/11/18   Alexis Frock, MD  loratadine (CLARITIN) 10 MG tablet Take 10  mg by mouth at bedtime.     [provider]  simvastatin (ZOCOR) 40 MG tablet Take 1 tablet (40 mg total) by mouth every evening. Patient not taking: Reported on 09/19/2018 09/26/13   Runell Gess, MD  sulfamethoxazole-trimethoprim (BACTRIM,SEPTRA) 400-80 MG tablet Take 1 tablet by mouth daily.     [provider]  traMADol (ULTRAM) 50 MG tablet Take 1-2 tablets (50-100 mg total) by mouth every 6 (six) hours as needed for severe pain. 09/27/18   Darnell Level, MD  tretinoin (RETIN-A) 0.1 % cream Apply 1 application topically at bedtime.    [provider]  venlafaxine XR (EFFEXOR-XR) 75 MG 24 hr capsule Take 75 mg by mouth daily. 08/23/18   [provider]    ALLERGIES:  Allergies  Allergen Reactions   . Tetracyclines & Related Hives  . Codeine Itching    SOCIAL HISTORY:  Social History   Tobacco Use  . Smoking status: Former Smoker    Packs/day: 0.25    Years: 15.00    Pack years: 3.75    Types: Cigarettes    Quit date: 05/17/2007    Years since quitting: 12.8  . Smokeless tobacco: Never Used  Substance Use Topics  . Alcohol use: Yes    Comment: rare    FAMILY HISTORY: Family History  Problem Relation Age of Onset  . Arthritis Mother   . Mental illness Mother   . Hypertension Mother   . Stroke Mother   . Diabetes Father   . Heart disease Father   . Hypertension Father   . Cancer Paternal Grandmother   . Hypertension Brother     EXAM: BP 117/79   Pulse 81   Temp 98 F (36.7 C)   Ht 5\' 1"  (1.549 m)   Wt 67.6 kg   SpO2 98%   BMI 28.15 kg/m RR 20  CONSTITUTIONAL: Alert and oriented and responds appropriately to questions. Well-appearing; well-nourished.  History limited as patient is a poor historian. HEAD: Normocephalic, atraumatic EYES: Conjunctivae clear, pupils appear equal, EOM appear intact, no nystagmus ENT: normal nose; moist mucous membranes; TMs are clear bilaterally without erythema, purulence, bulging, perforation, effusion.  No cerumen impaction or sign of foreign body in the external auditory canal. No inflammation, erythema or drainage from the external auditory canal. No signs of mastoiditis. No pain with manipulation of the pinna bilaterally. NECK: Supple, normal ROM, no meningismus CARD: RRR; S1 and S2 appreciated; no murmurs, no clicks, no rubs, no gallops RESP: Normal chest excursion without splinting or tachypnea; breath sounds clear and equal bilaterally; no wheezes, no rhonchi, no rales, no hypoxia or respiratory distress, speaking full sentences ABD/GI: Normal bowel sounds; non-distended; soft, non-tender, no rebound, no guarding, no peritoneal signs, no hepatosplenomegaly BACK:  The back appears normal EXT: Normal ROM in all joints; no  deformity noted, no edema; no cyanosis SKIN: Normal color for age and race; warm; no rash on exposed skin NEURO: Moves all extremities equally, reports normal sensation diffusely, cranial nerves II through XII intact, normal speech, no nystagmus PSYCH: The patient's mood and manner are appropriate.   MEDICAL DECISION MAKING: Patient here with symptoms of what seems to be peripheral vertigo.  Symptoms worse with standing upright and moving.  No sign of otitis media, otitis externa, mastoiditis on exam.  Neurologically intact here.  Has tingling in both hands but daughter reports that patient had been hyperventilating.  Will check basic labs and urine to ensure no anemia, electrolyte derangement, dehydration, UTI  contributing to her symptoms.  Low suspicion that she is having a stroke today.  Has been able to ambulate without ataxic gait.  NIH stroke scale is 0.  Outside of TPA window at this point given last seen normal at 10:30 PM.  Doubt intracranial hemorrhage.  Having mild headache currently but reports this is not abnormal for her.  Not on blood thinners and no head injury.  No fever, meningismus on exam.  Doubt meningitis, encephalitis.  Will treat symptomatically with IV fluids, Zofran, meclizine.  Family at bedside to drive her home once feeling better.  Blood sugar here normal.  ED PROGRESS: 6:15 AM Pt reported feeling better.  Was able to ambulate to the bathroom without assistance with steady gait without ataxia.  While providing urine sample she began feeling dizzy again and had hard time ambulating.  No drop in blood pressure.  Will give Valium and continue IV hydration.  Labs reviewed by myself and show no anemia.  She does have potassium of 3.1 but I do not think this is the cause of her vertigo.  Will give oral replacement.  Other electrolytes, renal function within normal limits.  Urinalysis pending.  Patient continues to be neurologically intact.  No headache.  Tingling in her hands has  resolved.  Symptoms only present with changes in position.  Again suspect peripheral vertigo rather than central etiology.  Urine shows no sign of infection or dehydration.  Signed out to oncoming ED physician to reassess patient after Valium.   I reviewed all nursing notes and pertinent previous records as available.  I have interpreted any EKGs, lab and urine results, imaging (as available).    EKG Interpretation  Date/Time:  Sunday March 24 2020 05:01:15 EDT Ventricular Rate:  67 PR Interval:    QRS Duration: 95 QT Interval:  414 QTC Calculation: 437 R Axis:   50 Text Interpretation: Ectopic atrial rhythm Confirmed by Rochele Raring [phone]) on 03/24/2020 5:04:44 AM         Kathy Kim was evaluated in Emergency Department on 03/24/2020 for the symptoms described in the history of present illness. She was evaluated in the context of the global COVID-19 pandemic, which necessitated consideration that the patient might be at risk for infection with the SARS-CoV-2 virus that causes COVID-19. Institutional protocols and algorithms that pertain to the evaluation of patients at risk for COVID-19 are in a state of rapid change based on information released by regulatory bodies including the CDC and federal and state organizations. These policies and algorithms were followed during the patient's care in the ED.      Flor Whitacre, Layla Maw, DO 03/24/20 [id number]

## 2020-03-24 NOTE — ED Notes (Signed)
Pt ambulated to bathroom with assistance from RN. Pt able to walk to bathroom without holding rail and stated her dizziness seemed better. After providing urine sample pt went to stand from toilet and became dizzy again and required a wheelchair to get back to her room. EDP notified.

## 2020-03-24 NOTE — ED Notes (Signed)
ED Provider at bedside. 

## 2020-03-24 NOTE — ED Triage Notes (Signed)
Pt states she woke up at 0300 to go to the bathroom and felt like her "equilibrium was off". Also states her mouth is dry, she is nauseated and BIL hands are tingling. Pt mentions w/u being done with pulmonologist due to chronic cough since having Covid19 in December. Pt A&O and able to answer questions.

## 2022-04-28 ENCOUNTER — Other Ambulatory Visit: Payer: Self-pay

## 2022-04-28 ENCOUNTER — Emergency Department (HOSPITAL_BASED_OUTPATIENT_CLINIC_OR_DEPARTMENT_OTHER)
Admission: EM | Admit: 2022-04-28 | Discharge: 2022-04-28 | Disposition: A | Discharge status: Home / Self Care | Payer: Medicare Other | Attending: Emergency Medicine | Admitting: Emergency Medicine

## 2022-04-28 ENCOUNTER — Encounter (HOSPITAL_BASED_OUTPATIENT_CLINIC_OR_DEPARTMENT_OTHER): Payer: Self-pay | Admitting: Emergency Medicine

## 2022-04-28 ENCOUNTER — Emergency Department (HOSPITAL_BASED_OUTPATIENT_CLINIC_OR_DEPARTMENT_OTHER): Payer: Medicare Other

## 2022-04-28 ENCOUNTER — Other Ambulatory Visit (HOSPITAL_BASED_OUTPATIENT_CLINIC_OR_DEPARTMENT_OTHER): Payer: Self-pay

## 2022-04-28 DIAGNOSIS — R0602 Shortness of breath: Secondary | ICD-10-CM | POA: Insufficient documentation

## 2022-04-28 DIAGNOSIS — N132 Hydronephrosis with renal and ureteral calculous obstruction: Secondary | ICD-10-CM | POA: Diagnosis not present

## 2022-04-28 DIAGNOSIS — R748 Abnormal levels of other serum enzymes: Secondary | ICD-10-CM | POA: Diagnosis not present

## 2022-04-28 DIAGNOSIS — N200 Calculus of kidney: Secondary | ICD-10-CM

## 2022-04-28 DIAGNOSIS — R1031 Right lower quadrant pain: Secondary | ICD-10-CM | POA: Diagnosis present

## 2022-04-28 LAB — URINALYSIS, ROUTINE W REFLEX MICROSCOPIC
Bilirubin Urine: NEGATIVE
Glucose, UA: NEGATIVE mg/dL
Ketones, ur: NEGATIVE mg/dL
Leukocytes,Ua: NEGATIVE
Nitrite: NEGATIVE
Protein, ur: NEGATIVE mg/dL
Specific Gravity, Urine: 1.015 (ref 1.005–1.030)
pH: 7 (ref 5.0–8.0)

## 2022-04-28 LAB — COMPREHENSIVE METABOLIC PANEL
ALT: 17 U/L (ref 0–44)
AST: 20 U/L (ref 15–41)
Albumin: 3.6 g/dL (ref 3.5–5.0)
Alkaline Phosphatase: 79 U/L (ref 38–126)
Anion gap: 10 (ref 5–15)
BUN: 19 mg/dL (ref 8–23)
CO2: 21 mmol/L — ABNORMAL LOW (ref 22–32)
Calcium: 8.9 mg/dL (ref 8.9–10.3)
Chloride: 108 mmol/L (ref 98–111)
Creatinine, Ser: 0.79 mg/dL (ref 0.44–1.00)
GFR, Estimated: >60 mL/min (ref >60)
Glucose, Bld: 140 mg/dL — ABNORMAL HIGH (ref 70–99)
Potassium: 3.3 mmol/L — ABNORMAL LOW (ref 3.5–5.1)
Sodium: 139 mmol/L (ref 135–145)
Total Bilirubin: 0.4 mg/dL (ref 0.3–1.2)
Total Protein: 6.8 g/dL (ref 6.5–8.1)

## 2022-04-28 LAB — CBC
HCT: 40.9 % (ref 36.0–46.0)
Hemoglobin: 14 g/dL (ref 12.0–15.0)
MCH: 29.2 pg (ref 26.0–34.0)
MCHC: 34.2 g/dL (ref 30.0–36.0)
MCV: 85.2 fL (ref 80.0–100.0)
Platelets: 330 10*3/uL (ref 150–400)
RBC: 4.8 MIL/uL (ref 3.87–5.11)
RDW: 13.5 % (ref 11.5–15.5)
WBC: 7.5 10*3/uL (ref 4.0–10.5)
nRBC: 0 % (ref 0.0–0.2)

## 2022-04-28 LAB — URINALYSIS, MICROSCOPIC (REFLEX)

## 2022-04-28 LAB — LIPASE, BLOOD: Lipase: 112 U/L — ABNORMAL HIGH (ref 11–51)

## 2022-04-28 LAB — LACTIC ACID, PLASMA: Lactic Acid, Venous: 1.9 mmol/L (ref 0.5–1.9)

## 2022-04-28 MED ORDER — HYDROMORPHONE HCL 1 MG/ML IJ SOLN
1.0000 mg | Freq: Once | INTRAMUSCULAR | Status: AC
  Administered 2022-04-28: 1 mg via INTRAVENOUS
  Filled 2022-04-28: qty 1

## 2022-04-28 MED ORDER — LACTATED RINGERS IV BOLUS
1000.0000 mL | Freq: Once | INTRAVENOUS | Status: AC
  Administered 2022-04-28: 1000 mL via INTRAVENOUS

## 2022-04-28 MED ORDER — TAMSULOSIN HCL 0.4 MG PO CAPS
0.4000 mg | ORAL_CAPSULE | Freq: Every day | ORAL | 0 refills | Status: AC
  Filled 2022-04-28: qty 30, 30d supply, fill #0

## 2022-04-28 MED ORDER — IOHEXOL 300 MG/ML  SOLN
100.0000 mL | Freq: Once | INTRAMUSCULAR | Status: AC | PRN
  Administered 2022-04-28: 100 mL via INTRAVENOUS

## 2022-04-28 MED ORDER — KETOROLAC TROMETHAMINE 10 MG PO TABS
10.0000 mg | ORAL_TABLET | Freq: Four times a day (QID) | ORAL | 0 refills | Status: AC | PRN
Start: 2022-04-28 — End: 2022-05-05
  Filled 2022-04-28: qty 28, 7d supply, fill #0

## 2022-04-28 MED ORDER — KETOROLAC TROMETHAMINE 30 MG/ML IJ SOLN
30.0000 mg | Freq: Once | INTRAMUSCULAR | Status: AC
  Administered 2022-04-28: 30 mg via INTRAVENOUS
  Filled 2022-04-28: qty 1

## 2022-04-28 MED ORDER — ONDANSETRON HCL 4 MG/2ML IJ SOLN
4.0000 mg | Freq: Once | INTRAMUSCULAR | Status: AC | PRN
Start: 2022-04-28 — End: 2022-04-28
  Administered 2022-04-28: 4 mg via INTRAVENOUS
  Filled 2022-04-28: qty 2

## 2022-04-28 NOTE — ED Triage Notes (Signed)
Pt is c/o generalized abd pain and back pain   Pt states the pain started this morning when she got up   Pt has known kidney stones in both kidneys and also has a hernia that she is scheduled to have surgery tomorrow to repair   Pt is c/o nausea and also hyperventilating during triage   ?

## 2022-04-28 NOTE — Discharge Instructions (Addendum)
Dear Ms. Clinkscale, ? ?You were in the ED because of your severe pain that was likely caused by your kidney stone on the left side. Please take flomax once a day until you are able to see the Urologist. Additionally, you can take toradol up to 4 times a day as needed for your pain. If your pain is uncontrollable or if you start to develops fevers, please return to the ED.  ?

## 2022-04-28 NOTE — ED Provider Notes (Signed)
?[facility] EMERGENCY DEPARTMENT ?Provider Note ? ? ?CSN: [id number] ?Arrival date & time: 04/28/22  8295 ? ?  ? ?History ? ?Chief Complaint  ?Patient presents with  ? Abdominal Pain  ? ? ?Kathy Kim is a 66 y.o. female with an abdominal wall hernia and known kidney stones who presents to the ED with acute onset abdominal pain that started this morning when she woke up. Pain is worse in right upper/lower quadrants, although she has generalized pain throughout her abdomen. The patient states that she has been having ongoing pain from her hernia, but today her pain was different and was much more severe. She is nauseous, although has not vomited today. Last normal BM was yesterday, no BM today and she believes she has passed some gas. The patient also states that she feels like she is going to pass out and she is short of breath. Denies any fevers, chills, chest pain, vomiting, diarrhea, melena, hematochezia, dysuria, or hematuria. She does have left flank pain and she states that she has bilateral known kidney stones.  ? ?The history is provided by the patient. No language interpreter was used.  ?Abdominal Pain ?Pain location:  RLQ and RUQ ?Pain radiates to:  Epigastric region and L flank ?Pain severity:  Severe ?Onset quality:  Sudden ?Duration:  2 hours ?Timing:  Constant ?Progression:  Worsening ?Chronicity:  New ?Context: awakening from sleep   ?Context: not diet changes, not suspicious food intake and not trauma   ?Relieved by:  None tried ?Ineffective treatments:  None tried ?Associated symptoms: nausea and shortness of breath   ?Associated symptoms: no diarrhea, no fever, no hematemesis, no hematochezia, no hematuria, no melena and no vomiting   ? ?  ? ?Home Medications ?Prior to Admission medications   ?Medication Sig Start Date End Date Taking? Authorizing Provider  ?ketorolac (TORADOL) 10 MG tablet Take 1 tablet (10 mg total) by mouth every 6 (six) hours as needed for up to 7 days for moderate  pain or severe pain. 04/28/22 05/05/22 Yes Paizleigh Wilds N, DO  ?tamsulosin (FLOMAX) 0.4 MG CAPS capsule Take 1 capsule (0.4 mg total) by mouth daily after breakfast. 04/28/22  Yes Sincere Liuzzi N, DO  ?atorvastatin (LIPITOR) 20 MG tablet Take 20 mg by mouth daily.     [provider]  ?cephALEXin (KEFLEX) 500 MG capsule Take 1 capsule (500 mg total) by mouth 2 (two) times daily. X 3 days to prevent infection with tethered stent in place. ?Patient not taking: Reported on 09/19/2018 02/11/18   Sebastian Ache, MD  ?ibuprofen (ADVIL,MOTRIN) 200 MG tablet Take 400 mg by mouth every 4 (four) hours as needed for moderate pain.     [provider]  ?indapamide (LOZOL) 1.25 MG tablet Take 1.25 mg by mouth daily.     [provider]  ?loratadine (CLARITIN) 10 MG tablet Take 10 mg by mouth at bedtime.     [provider]  ?meclizine (ANTIVERT) 25 MG tablet Take 1-2 tablets (25-50 mg total) by mouth 3 (three) times daily as needed for dizziness. 03/24/20   Ward, Layla Maw, DO  ?ondansetron (ZOFRAN ODT) 4 MG disintegrating tablet Take 1 tablet (4 mg total) by mouth every 6 (six) hours as needed for nausea or vomiting. 03/24/20   Ward, Layla Maw, DO  ?simvastatin (ZOCOR) 40 MG tablet Take 1 tablet (40 mg total) by mouth every evening. ?Patient not taking: Reported on 09/19/2018 09/26/13   Runell Gess, MD  ?sulfamethoxazole-trimethoprim (BACTRIM,SEPTRA) 400-80  MG tablet Take 1 tablet by mouth daily.     [provider]  ?traMADol (ULTRAM) 50 MG tablet Take 1-2 tablets (50-100 mg total) by mouth every 6 (six) hours as needed for severe pain. 09/27/18   Darnell LevelGerkin, Todd, MD  ?tretinoin (RETIN-A) 0.1 % cream Apply 1 application topically at bedtime.    [provider]  ?venlafaxine XR (EFFEXOR-XR) 75 MG 24 hr capsule Take 75 mg by mouth daily. 08/23/18   [provider]  ?   ? ?Allergies    ?Tetracyclines & related and Codeine   ? ?Review of Systems   ?Review of Systems   ?Constitutional:  Negative for fever.  ?Respiratory:  Positive for shortness of breath.   ?Gastrointestinal:  Positive for abdominal pain and nausea. Negative for diarrhea, hematemesis, hematochezia, melena and vomiting.  ?Genitourinary:  Negative for hematuria.  ? ?Physical Exam ?Updated Vital Signs ?BP 112/66   Pulse (!) 52   Temp (!) 97.3 ?F (36.3 ?C) (Oral)   Resp 17   Ht 5' 1.5" (1.562 m)   Wt 66.7 kg   SpO2 95%   BMI 27.33 kg/m?  ?Physical Exam ?Constitutional:   ?   General: She is in acute distress.  ?HENT:  ?   Head: Normocephalic and atraumatic.  ?Cardiovascular:  ?   Rate and Rhythm: Normal rate and regular rhythm.  ?   Heart sounds: No murmur heard. ?Pulmonary:  ?   Breath sounds: Normal breath sounds. No wheezing.  ?   Comments: Tachypneic with an increased work of breathing ?Abdominal:  ?   General: Bowel sounds are decreased. There is no distension.  ?   Palpations: Abdomen is soft.  ?   Tenderness: There is generalized abdominal tenderness. There is left CVA tenderness. There is no right CVA tenderness.  ?   Hernia: A hernia is present.  ?   Comments: Known right abdominal wall hernia, not protuberant   ?Skin: ?   General: Skin is warm and dry.  ?Neurological:  ?   General: No focal deficit present.  ?   Mental Status: She is alert and oriented to person, place, and time.  ? ? ?ED Results / Procedures / Treatments   ?Labs ?(all labs ordered are listed, but only abnormal results are displayed) ?Labs Reviewed  ?LIPASE, BLOOD - Abnormal; Notable for the following components:  ?    Result Value  ? Lipase 112 (*)   ? All other components within normal limits  ?COMPREHENSIVE METABOLIC PANEL - Abnormal; Notable for the following components:  ? Potassium 3.3 (*)   ? CO2 21 (*)   ? Glucose, Bld 140 (*)   ? All other components within normal limits  ?URINALYSIS, ROUTINE W REFLEX MICROSCOPIC - Abnormal; Notable for the following components:  ? Hgb urine dipstick MODERATE (*)   ? All other components  within normal limits  ?URINALYSIS, MICROSCOPIC (REFLEX) - Abnormal; Notable for the following components:  ? Bacteria, UA RARE (*)   ? All other components within normal limits  ?URINE CULTURE  ?CBC  ?LACTIC ACID, PLASMA  ? ? ?EKG ?None ? ?Radiology ?CT ABDOMEN PELVIS W CONTRAST ? ?Result Date: 04/28/2022 ?CLINICAL DATA:  Abdominal pain and back pain that began this morning. EXAM: CT ABDOMEN AND PELVIS WITH CONTRAST TECHNIQUE: Multidetector CT imaging of the abdomen and pelvis was performed using the standard protocol following bolus administration of intravenous contrast. RADIATION DOSE REDUCTION: This exam was performed according to the departmental dose-optimization program which includes  automated exposure control, adjustment of the mA and/or kV according to patient size and/or use of iterative reconstruction technique. CONTRAST:  OMNIPAQUE IOHEXOL 300 MG/ML  SOLN COMPARISON:  February 27, 2022. FINDINGS: Lower chest: Basilar atelectasis. No effusion or consolidative changes. Hepatobiliary: No focal, suspicious hepatic lesion. No pericholecystic stranding. No biliary duct dilation. Portal vein is patent. Pancreas: Normal, without mass, inflammation or ductal dilatation. Spleen: Normal. Adrenals/Urinary Tract: Adrenal glands are normal. Marked cortical scarring on the RIGHT with parenchymal loss mainly in the interpolar RIGHT kidney less pronounced in the lower pole. No suspicious renal lesion on the RIGHT or hydronephrosis. 4 mm calculus in the upper pole and a 7-8 mm calculus in the lower pole the RIGHT kidney. No ureteral calculi on the RIGHT. LEFT proximal ureteric calculus 4 mm with mild to moderate LEFT-sided hydronephrosis but with signs of delayed enhancement of the LEFT as compared to the RIGHT kidney. No suspicious renal lesion on the RIGHT. Nephrolithiasis also present with a 5 mm calculus in the lower pole. Stomach/Bowel: No acute gastrointestinal findings. Diverticular disease of the sigmoid. Normal  appendix. Vascular/Lymphatic: Aortic atherosclerosis. No sign of aneurysm. Smooth contour of the IVC. There is no gastrohepatic or hepatoduodenal ligament lymphadenopathy. No retroperitoneal or mesenteric lymphadenopathy.

## 2022-04-29 LAB — URINE CULTURE: Culture: <10000 — AB

## 2022-05-01 ENCOUNTER — Encounter (HOSPITAL_BASED_OUTPATIENT_CLINIC_OR_DEPARTMENT_OTHER): Payer: Self-pay | Admitting: Urology

## 2022-05-01 ENCOUNTER — Other Ambulatory Visit: Payer: Self-pay | Admitting: Urology

## 2022-05-01 NOTE — Progress Notes (Signed)
Patient pre-op phone call complete. Patient arrival time and procedure date verified. Patient allergies, medications, and medical history verified. Patient advised not to have Pepto Bismol Ibuprofen, Toradol, or any other NSAIDs within 48 hours of procedure. No Aspirin or aspirin products within 72 hours prior. Patient advised not to take Lozol the morning of procedure.Patient advised to take laxative of choice on Sunday. Patient can have clear liquids until 0400 and should be NPO at midnight. Patient denied any recent chest pain or recent history of covid. Patient can walk up stairs without any shortness of breath and does not use any home O2. Patient states she occasionally vapes. RN advised patient not to have any nicotine or tobacco products within 6 hours of procedure. Driver secured.  ?

## 2022-05-04 ENCOUNTER — Ambulatory Visit (HOSPITAL_BASED_OUTPATIENT_CLINIC_OR_DEPARTMENT_OTHER)
Admission: RE | Admit: 2022-05-04 | Discharge: 2022-05-04 | Disposition: A | Discharge status: Home / Self Care | Payer: Medicare Other | Source: Ambulatory Visit | Attending: Urology | Admitting: Urology

## 2022-05-04 ENCOUNTER — Other Ambulatory Visit: Payer: Self-pay

## 2022-05-04 ENCOUNTER — Encounter (HOSPITAL_BASED_OUTPATIENT_CLINIC_OR_DEPARTMENT_OTHER): Payer: Self-pay | Admitting: Urology

## 2022-05-04 ENCOUNTER — Encounter (HOSPITAL_BASED_OUTPATIENT_CLINIC_OR_DEPARTMENT_OTHER)
Admission: RE | Disposition: A | Discharge status: Home / Self Care | Payer: Self-pay | Source: Ambulatory Visit | Attending: Urology

## 2022-05-04 ENCOUNTER — Other Ambulatory Visit (HOSPITAL_BASED_OUTPATIENT_CLINIC_OR_DEPARTMENT_OTHER): Payer: Self-pay

## 2022-05-04 ENCOUNTER — Ambulatory Visit (HOSPITAL_COMMUNITY): Payer: Medicare Other

## 2022-05-04 DIAGNOSIS — Z8719 Personal history of other diseases of the digestive system: Secondary | ICD-10-CM | POA: Insufficient documentation

## 2022-05-04 DIAGNOSIS — Z87891 Personal history of nicotine dependence: Secondary | ICD-10-CM | POA: Diagnosis not present

## 2022-05-04 DIAGNOSIS — N201 Calculus of ureter: Secondary | ICD-10-CM | POA: Insufficient documentation

## 2022-05-04 HISTORY — PX: EXTRACORPOREAL SHOCK WAVE LITHOTRIPSY: SHX1557

## 2022-05-04 SURGERY — LITHOTRIPSY, ESWL
Anesthesia: LOCAL | Laterality: Left

## 2022-05-04 MED ORDER — CIPROFLOXACIN HCL 500 MG PO TABS
ORAL_TABLET | ORAL | Status: AC
  Filled 2022-05-04: qty 1

## 2022-05-04 MED ORDER — DIAZEPAM 5 MG PO TABS
10.0000 mg | ORAL_TABLET | ORAL | Status: AC
  Administered 2022-05-04: 10 mg via ORAL

## 2022-05-04 MED ORDER — SODIUM CHLORIDE 0.9 % IV SOLN: INTRAVENOUS | Status: DC

## 2022-05-04 MED ORDER — ONDANSETRON 4 MG PO TBDP: 4.0000 mg | ORAL_TABLET | Freq: Four times a day (QID) | ORAL | 0 refills | Status: DC | PRN

## 2022-05-04 MED ORDER — DIPHENHYDRAMINE HCL 25 MG PO CAPS
25.0000 mg | ORAL_CAPSULE | ORAL | Status: AC
Start: 2022-05-04 — End: 2022-05-04
  Administered 2022-05-04: 25 mg via ORAL

## 2022-05-04 MED ORDER — DIPHENHYDRAMINE HCL 25 MG PO CAPS
ORAL_CAPSULE | ORAL | Status: AC
  Filled 2022-05-04: qty 1

## 2022-05-04 MED ORDER — DIAZEPAM 5 MG PO TABS
ORAL_TABLET | ORAL | Status: AC
  Filled 2022-05-04: qty 2

## 2022-05-04 MED ORDER — CIPROFLOXACIN HCL 500 MG PO TABS
500.0000 mg | ORAL_TABLET | ORAL | Status: AC
  Administered 2022-05-04: 500 mg via ORAL

## 2022-05-04 NOTE — Discharge Instructions (Signed)
1 - You may have urinary urgency (bladder spasms), pass small stone fragments, and bloody urine on / off for up to 2 weeks. This is normal. ° °2 - Call MD or go to ER for fever >102, severe pain / nausea / vomiting not relieved by medications, or acute change in medical status ° °

## 2022-05-04 NOTE — H&P (Signed)
Kathy Kim is an 66 y.o. female.   ? ?Chief Complaint: Pre-OP LEFT Shockwave Lithotripsy ? ?HPI:  ? ?1 - Recurent Urolithiasis -  ?Pre 2023 - ureteroscopy x several ?04/2022 - Left 66mm proximal stone (just below L4 transverse process) by ER CT as well as R>L non-obsructing renal stones.,  ? ?Today "Kathy Kim" is seen to proceed with LEFT shockwave lithotripsy. NO interval fevers. Most recent UCX negative.  ? ?Past Medical History:  ?Diagnosis Date  ? Arthritis   ? De Quervain's disease (tenosynovitis)   ? Left  ? Depression   ? Family history of adverse reaction to anesthesia   ? grandmother (deceased) was slow to wake   ? Frequency of urination   ? GERD (gastroesophageal reflux disease)   ? Headache   ? Heart murmur   ? was told this as a teenager when she had physical to try out for cheerleading;  f/u for eval with caridiologist who did a cath d/t family history of heart disease; was told cath was normal   ? Hematuria   ? History of kidney stones   ? Hyperlipidemia   ? Inguinal hernia   ? Right  ? Nephrolithiasis   ? bilateral   ? Numbness and tingling in both hands   ? Ovarian cyst   ? Rosacea   ? Sensation of pressure in bladder area   ? Sigmoid diverticulosis 06/21/2017  ? Noted on CT abd/pelvis at [facility]  ? Urge urinary incontinence   ? ? ?Past Surgical History:  ?Procedure Laterality Date  ? ABDOMINOPLASTY  JAN 2014  ? CARDIAC CATHETERIZATION  09-05-2007  DR BERRY  ? NORMAL CORONARIES AND LVF  ? COLONOSCOPY    ? CYSTOSCOPY W/ URETERAL STENT PLACEMENT Right 05/11/2013  ? Procedure: CYSTOSCOPY WITH RETROGRADE PYELOGRAM/URETERAL STENT PLACEMENT, BLADDER BIOPSY;  Surgeon: Milford Cage, MD;  Location[facility];  Service: Urology;  Laterality: Right;  cysto, bil retrogrades, stent placement   ? CYSTOSCOPY W/ URETERAL STENT PLACEMENT Bilateral 05/23/2013  ? Procedure: CYSTOSCOPY WITH RETROGRADE PYELOGRAM/URETERAL STENT PLACEMENT;  Surgeon: Kathi Ludwig, MD;  Location: [facility];   Service: Urology;  Laterality: Bilateral;  ? CYSTOSCOPY W/ URETERAL STENT PLACEMENT Bilateral 05/31/2013  ? Procedure: CYSTOSCOPY WITH STENT REPLACEMENT;  Surgeon: Milford Cage, MD;  Location: [facility];  Service: Urology;  Laterality: Bilateral;  ? CYSTOSCOPY WITH RETROGRADE PYELOGRAM, URETEROSCOPY AND STENT PLACEMENT Right 05/18/2013  ? Procedure: CYSTOSCOPY WITH RETROGRADE PYELOGRAM, URETEROSCOPY AND STENT PLACEMENT;  Surgeon: Milford Cage, MD;  Location: [facility];  Service: Urology;  Laterality: Right;  STENT EXCHANGE ?  ? CYSTOSCOPY WITH RETROGRADE PYELOGRAM, URETEROSCOPY AND STENT PLACEMENT Bilateral 01/28/2018  ? Procedure: CYSTOSCOPY WITH RETROGRADE PYELOGRAM, URETEROSCOPY AND STENT PLACEMENT, STONE BASKETRY;  Surgeon: Sebastian Ache, MD;  Location: [facility];  Service: Urology;  Laterality: Bilateral;  ? CYSTOSCOPY WITH RETROGRADE PYELOGRAM, URETEROSCOPY AND STENT PLACEMENT Bilateral 02/11/2018  ? Procedure: CYSTOSCOPY WITH RETROGRADE PYELOGRAM, URETEROSCOPY AND STENT PLACEMENT BASKET EXTRACTION;  Surgeon: Sebastian Ache, MD;  Location: [facility];  Service: Urology;  Laterality: Bilateral;  ? CYSTOSCOPY/RETROGRADE/URETEROSCOPY/STONE EXTRACTION WITH BASKET Bilateral 05/31/2013  ? Procedure: CYSTOSCOPY/RETROGRADE/URETEROSCOPY/STONE EXTRACTION WITH BASKET;  Surgeon: Milford Cage, MD;  Location: [facility];  Service: Urology;  Laterality: Bilateral;  ? EXCISIONAL HEMORRHOIDECTOMY    ? HOLMIUM LASER APPLICATION Right 05/18/2013  ? Procedure: HOLMIUM LASER APPLICATION;  Surgeon: Milford Cage, MD;  Location: [facility];  Service: Urology;  Laterality: Right;  ? HOLMIUM LASER APPLICATION Bilateral 01/28/2018  ? Procedure: HOLMIUM LASER APPLICATION;  Surgeon: Sebastian AcheManny, Glynnis Gavel, MD;  [facility];  Service: Urology;  Laterality: Bilateral;  ? NM MYOCAR PERF WALL  MOTION  07/02/2005  ? negative  ? RIGHT URETEROSCOPIC STONE EXTRACITON  03-23-2001  ? TONSILLECTOMY    ? TUBAL LIGATION    ? TYMPANOPLASTY  AGE 64  ? VENTRAL HERNIA REPAIR N/A 09/27/2018  ? Procedure: DIAGNOSTIC LAPAROSCOPY;  Surgeon: Darnell LevelGerkin, Todd, MD;  Location: [facility];  Service: General;  Laterality: N/A;  ? ? ?Family History  ?Problem Relation Age of Onset  ? Arthritis Mother   ? Mental illness Mother   ? Hypertension Mother   ? Stroke Mother   ? Diabetes Father   ? Heart disease Father   ? Hypertension Father   ? Cancer Paternal Grandmother   ? Hypertension Brother   ? ?Social History:  reports that she quit smoking about 14 years ago. Her smoking use included cigarettes. She has a 3.75 pack-year smoking history. She has never used smokeless tobacco. She reports that she does not currently use alcohol. She reports that she does not use drugs. ? ?Allergies:  ?Allergies  ?Allergen Reactions  ? Tetracyclines & Related Hives  ? Codeine Itching  ? ? ?Medications Prior to Admission  ?Medication Sig Dispense Refill  ? atorvastatin (LIPITOR) 20 MG tablet Take 20 mg by mouth daily.     ? buPROPion (WELLBUTRIN XL) 150 MG 24 hr tablet Take 150 mg by mouth daily.    ? ibuprofen (ADVIL,MOTRIN) 200 MG tablet Take 400 mg by mouth every 4 (four) hours as needed for moderate pain.     ? indapamide (LOZOL) 1.25 MG tablet Take 1.25 mg by mouth daily.     ? ketorolac (TORADOL) 10 MG tablet Take 1 tablet (10 mg total) by mouth every 6 (six) hours as needed for up to 7 days for moderate pain or severe pain. 28 tablet 0  ? loratadine (CLARITIN) 10 MG tablet Take 10 mg by mouth at bedtime.     ? ondansetron (ZOFRAN ODT) 4 MG disintegrating tablet Take 1 tablet (4 mg total) by mouth every 6 (six) hours as needed for nausea or vomiting. 20 tablet 0  ? phentermine 30 MG capsule Take 30 mg by mouth every morning. 1 tablet every morning.    ? sertraline (ZOLOFT) 100 MG tablet Take 100 mg by mouth daily.    ? tamsulosin (FLOMAX) 0.4 MG CAPS  capsule Take 1 capsule (0.4 mg total) by mouth daily after breakfast. 30 capsule 0  ? tretinoin (RETIN-A) 0.1 % cream Apply 1 application topically at bedtime.    ? venlafaxine XR (EFFEXOR-XR) 75 MG 24 hr capsule Take 75 mg by mouth daily.  0  ? cephALEXin (KEFLEX) 500 MG capsule Take 1 capsule (500 mg total) by mouth 2 (two) times daily. X 3 days to prevent infection with tethered stent in place. (Patient not taking: Reported on 09/19/2018) 6 capsule 0  ? meclizine (ANTIVERT) 25 MG tablet Take 1-2 tablets (25-50 mg total) by mouth 3 (three) times daily as needed for dizziness. 30 tablet 0  ? simvastatin (ZOCOR) 40 MG tablet Take 1 tablet (40 mg total) by mouth every evening. (Patient not taking: Reported on 09/19/2018) 30 tablet 6  ? sulfamethoxazole-trimethoprim (BACTRIM,SEPTRA) 400-80 MG tablet Take 1 tablet by mouth daily.     ? traMADol (ULTRAM) 50 MG tablet Take 1-2 tablets (  50-100 mg total) by mouth every 6 (six) hours as needed for severe pain. 15 tablet 0  ? ? ?No results found for this or any previous visit (from the past 48 hour(s)). ?No results found. ? ?Review of Systems  ?Constitutional:  Negative for chills and fever.  ?Genitourinary:  Positive for flank pain.  ?All other systems reviewed and are negative. ? ?There were no vitals taken for this visit. ?Physical Exam ?Vitals reviewed.  ?HENT:  ?   Head: Normocephalic.  ?   Nose: Nose normal.  ?   Mouth/Throat:  ?   Mouth: Mucous membranes are moist.  ?Cardiovascular:  ?   Rate and Rhythm: Normal rate.  ?Pulmonary:  ?   Effort: Pulmonary effort is normal.  ?Abdominal:  ?   General: Abdomen is flat.  ?Genitourinary: ?   Comments: Mild LEFT CVAT at present ?Neurological:  ?   General: No focal deficit present.  ?   Mental Status: She is alert.  ?Psychiatric:     ?   Mood and Affect: Mood normal.  ?  ? ?Assessment/Plan ? ?Proceed as planned with LEFT shockwave lithotripsy. Risks, benefits, alternatives, expected peri-treatment course discussed previously and  reiterated today.  ? ?Sebastian Ache, MD ?05/04/2022, 8:12 AM ? ? ? ?

## 2022-05-04 NOTE — Brief Op Note (Signed)
05/04/2022 ? ?10:55 AM ? ?PATIENT:  Kellie Moor  66 y.o. female ? ?PRE-OPERATIVE DIAGNOSIS:  LEFT PROXIMAL URETERAL AND LOWER POLES RENAL STONES ? ?POST-OPERATIVE DIAGNOSIS:  * No post-op diagnosis entered * ? ?PROCEDURE:  Procedure(s) with comments: ?EXTRACORPOREAL SHOCK WAVE LITHOTRIPSY (ESWL) (Left) - 75 MINUTES ? ?SURGEON:  Surgeon(s) and Role: ?   Alexis Frock, MD - Primary ? ?PHYSICIAN ASSISTANT:  ? ?ASSISTANTS: none  ? ?ANESTHESIA:   MAC ? ?EBL:  minimal  ? ?BLOOD ADMINISTERED:none ? ?DRAINS: none  ? ?LOCAL MEDICATIONS USED:  NONE ? ?SPECIMEN:  No Specimen ? ?DISPOSITION OF SPECIMEN:  N/A ? ?COUNTS:  YES ? ?TOURNIQUET:  * No tourniquets in log * ? ?DICTATION: .Note written in paper chart ? ?PLAN OF CARE: Discharge to home after PACU ? ?PATIENT DISPOSITION:  Short Stay ?  ?Delay start of Pharmacological VTE agent (>24hrs) due to surgical blood loss or risk of bleeding: yes ? ?

## 2022-05-05 ENCOUNTER — Encounter (HOSPITAL_BASED_OUTPATIENT_CLINIC_OR_DEPARTMENT_OTHER): Payer: Self-pay | Admitting: Urology

## 2022-11-18 ENCOUNTER — Other Ambulatory Visit (HOSPITAL_BASED_OUTPATIENT_CLINIC_OR_DEPARTMENT_OTHER): Payer: Self-pay

## 2022-11-18 ENCOUNTER — Encounter (HOSPITAL_BASED_OUTPATIENT_CLINIC_OR_DEPARTMENT_OTHER): Payer: Self-pay | Admitting: Urology

## 2022-11-18 ENCOUNTER — Other Ambulatory Visit: Payer: Self-pay

## 2022-11-18 ENCOUNTER — Emergency Department (HOSPITAL_BASED_OUTPATIENT_CLINIC_OR_DEPARTMENT_OTHER): Payer: Medicare Other

## 2022-11-18 ENCOUNTER — Emergency Department (HOSPITAL_BASED_OUTPATIENT_CLINIC_OR_DEPARTMENT_OTHER)
Admission: EM | Admit: 2022-11-18 | Discharge: 2022-11-18 | Disposition: A | Discharge status: Home / Self Care | Payer: Medicare Other | Attending: Emergency Medicine | Admitting: Emergency Medicine

## 2022-11-18 DIAGNOSIS — E119 Type 2 diabetes mellitus without complications: Secondary | ICD-10-CM | POA: Insufficient documentation

## 2022-11-18 DIAGNOSIS — Z79899 Other long term (current) drug therapy: Secondary | ICD-10-CM | POA: Insufficient documentation

## 2022-11-18 DIAGNOSIS — B029 Zoster without complications: Secondary | ICD-10-CM

## 2022-11-18 DIAGNOSIS — I10 Essential (primary) hypertension: Secondary | ICD-10-CM | POA: Diagnosis not present

## 2022-11-18 DIAGNOSIS — R079 Chest pain, unspecified: Secondary | ICD-10-CM | POA: Diagnosis present

## 2022-11-18 DIAGNOSIS — R1011 Right upper quadrant pain: Secondary | ICD-10-CM | POA: Insufficient documentation

## 2022-11-18 LAB — BASIC METABOLIC PANEL
Anion gap: 12 (ref 5–15)
BUN: 24 mg/dL — ABNORMAL HIGH (ref 8–23)
CO2: 24 mmol/L (ref 22–32)
Calcium: 8.8 mg/dL — ABNORMAL LOW (ref 8.9–10.3)
Chloride: 103 mmol/L (ref 98–111)
Creatinine, Ser: 0.95 mg/dL (ref 0.44–1.00)
GFR, Estimated: >60 mL/min (ref >60)
Glucose, Bld: 119 mg/dL — ABNORMAL HIGH (ref 70–99)
Potassium: 3 mmol/L — ABNORMAL LOW (ref 3.5–5.1)
Sodium: 139 mmol/L (ref 135–145)

## 2022-11-18 LAB — CBC
HCT: 43.3 % (ref 36.0–46.0)
Hemoglobin: 14.5 g/dL (ref 12.0–15.0)
MCH: 28.8 pg (ref 26.0–34.0)
MCHC: 33.5 g/dL (ref 30.0–36.0)
MCV: 85.9 fL (ref 80.0–100.0)
Platelets: 300 10*3/uL (ref 150–400)
RBC: 5.04 MIL/uL (ref 3.87–5.11)
RDW: 13.6 % (ref 11.5–15.5)
WBC: 5.9 10*3/uL (ref 4.0–10.5)
nRBC: 0 % (ref 0.0–0.2)

## 2022-11-18 LAB — TROPONIN I (HIGH SENSITIVITY): Troponin I (High Sensitivity): <2 ng/L (ref <18)

## 2022-11-18 MED ORDER — ACYCLOVIR 800 MG PO TABS
800.0000 mg | ORAL_TABLET | Freq: Every day | ORAL | 0 refills | Status: DC
  Filled 2022-11-18: qty 35, 7d supply, fill #0

## 2022-11-18 MED ORDER — ACYCLOVIR 800 MG PO TABS: 800.0000 mg | ORAL_TABLET | Freq: Every day | ORAL | 0 refills | Status: AC

## 2022-11-18 MED ORDER — KETOROLAC TROMETHAMINE 15 MG/ML IJ SOLN
15.0000 mg | Freq: Once | INTRAMUSCULAR | Status: AC
  Administered 2022-11-18: 15 mg via INTRAVENOUS
  Filled 2022-11-18: qty 1

## 2022-11-18 MED ORDER — POTASSIUM CHLORIDE CRYS ER 20 MEQ PO TBCR
40.0000 meq | EXTENDED_RELEASE_TABLET | Freq: Once | ORAL | Status: AC
  Administered 2022-11-18: 40 meq via ORAL
  Filled 2022-11-18: qty 2

## 2022-11-18 NOTE — ED Triage Notes (Signed)
Pt states sharp constant Right Sided chest pain that radiates to her back that started this am Reports recent "stomach bug" and has been taking zofran for nausea  Denies any known cardiac history

## 2022-11-18 NOTE — Discharge Instructions (Addendum)
It was a pleasure taking care of you today!  Your labs looked well overall. Your chest xray didn't show any concerning findings today. Your ultrasound didn't show any concerning findings for your gallbladder at this time. You will be sent a prescription for acyclovir, take as directed. It is important that you follow up with your primary care provider closely to monitor the area on your skin. You may take over the counter 600 mg ibuprofen and alternate with 500 mg tylenol every 6 hours as needed for your symptoms. Return to the ED if you are experiencing increasing/worsening symptoms.

## 2022-11-18 NOTE — ED Notes (Signed)
Chaperoned PA for skin exam.  Reddened splotches below right breast/torso, pt reports pain to area and into right axilla

## 2022-11-18 NOTE — ED Provider Notes (Signed)
[facility] EMERGENCY DEPARTMENT Provider Note   CSN: [id number] Arrival date & time: 11/18/22  1220     History  Chief Complaint  Patient presents with   Chest Pain    Kathy Kim is a 66 y.o. female who presents emergency department with concerns for sharp constant right-sided chest pain that radiates to her shoulder onset this morning.  Notes that she has had a recent stomach bug.  Has been taking Zofran for her nausea.  Has a history of cardiac catheterization without stents.  Denies history of MI, diabetes, hypertension.  Denies shortness of breath.  The history is provided by the patient. No language interpreter was used.       Home Medications Prior to Admission medications   Medication Sig Start Date End Date Taking? Authorizing Provider  acyclovir (ZOVIRAX) 800 MG tablet Take 1 tablet (800 mg total) by mouth 5 (five) times daily for 7 days. 11/18/22 11/25/22  Roshanda Balazs A, PA-C  atorvastatin (LIPITOR) 20 MG tablet Take 20 mg by mouth daily.     [provider]  buPROPion (WELLBUTRIN XL) 150 MG 24 hr tablet Take 150 mg by mouth daily.    [provider]  cephALEXin (KEFLEX) 500 MG capsule Take 1 capsule (500 mg total) by mouth 2 (two) times daily. X 3 days to prevent infection with tethered stent in place. Patient not taking: Reported on 09/19/2018 02/11/18   Sebastian AcheManny, Theodore, MD  ibuprofen (ADVIL,MOTRIN) 200 MG tablet Take 400 mg by mouth every 4 (four) hours as needed for moderate pain.     [provider]  indapamide (LOZOL) 1.25 MG tablet Take 1.25 mg by mouth daily.     [provider]  loratadine (CLARITIN) 10 MG tablet Take 10 mg by mouth at bedtime.     [provider]  meclizine (ANTIVERT) 25 MG tablet Take 1-2 tablets (25-50 mg total) by mouth 3 (three) times daily as needed for dizziness. 03/24/20   Ward, Layla MawKristen N, DO  ondansetron (ZOFRAN ODT) 4 MG disintegrating tablet Take 1 tablet (4 mg total) by mouth  every 6 (six) hours as needed for nausea or vomiting (from kidney stone). 05/04/22   Sebastian AcheManny, Theodore, MD  phentermine 30 MG capsule Take 30 mg by mouth every morning. 1 tablet every morning.    [provider]  sertraline (ZOLOFT) 100 MG tablet Take 100 mg by mouth daily.    [provider]  simvastatin (ZOCOR) 40 MG tablet Take 1 tablet (40 mg total) by mouth every evening. Patient not taking: Reported on 09/19/2018 09/26/13   Runell GessBerry, Jonathan J, MD  sulfamethoxazole-trimethoprim (BACTRIM,SEPTRA) 400-80 MG tablet Take 1 tablet by mouth daily.     [provider]  tamsulosin (FLOMAX) 0.4 MG CAPS capsule Take 1 capsule (0.4 mg total) by mouth daily after breakfast. 04/28/22   Atway, Rayann N, DO  traMADol (ULTRAM) 50 MG tablet Take 1-2 tablets (50-100 mg total) by mouth every 6 (six) hours as needed for severe pain. 09/27/18   Darnell LevelGerkin, Todd, MD  tretinoin (RETIN-A) 0.1 % cream Apply 1 application topically at bedtime.    [provider]  venlafaxine XR (EFFEXOR-XR) 75 MG 24 hr capsule Take 75 mg by mouth daily. 08/23/18   [provider]      Allergies    Tetracyclines & related and Codeine    Review of Systems   Review of Systems  Cardiovascular:  Positive for chest pain.  All other systems reviewed and are  negative.   Physical Exam Updated Vital Signs BP 107/72 (BP Location: Left Arm)   Pulse 66   Temp 98 F (36.7 C) (Oral)   Resp 15   Ht 5\' 2"  (1.575 m)   Wt 68 kg   SpO2 98%   BMI 27.42 kg/m  Physical Exam Vitals and nursing note reviewed.  Constitutional:      General: She is not in acute distress.    Appearance: She is not diaphoretic.  HENT:     Head: Normocephalic and atraumatic.     Mouth/Throat:     Pharynx: No oropharyngeal exudate.  Eyes:     General: No scleral icterus.    Conjunctiva/sclera: Conjunctivae normal.  Cardiovascular:     Rate and Rhythm: Normal rate and regular rhythm.     Pulses: Normal pulses.     Heart  sounds: Normal heart sounds.  Pulmonary:     Effort: Pulmonary effort is normal. No respiratory distress.     Breath sounds: Normal breath sounds. No wheezing.  Chest:     Comments: RN chaperone present for breast exam. Pt with several erythematous patches noted to right breast in a linear fashion with TTP at T4 dermatome. No obvious vesicles noted at this time. No TTP noted to ribs on right side.  Abdominal:     General: Bowel sounds are normal.     Palpations: Abdomen is soft. There is no mass.     Tenderness: There is abdominal tenderness in the right upper quadrant. There is no guarding or rebound.  Musculoskeletal:        General: Normal range of motion.     Cervical back: Normal range of motion and neck supple.  Skin:    General: Skin is warm and dry.  Neurological:     Mental Status: She is alert.  Psychiatric:        Behavior: Behavior normal.     ED Results / Procedures / Treatments   Labs (all labs ordered are listed, but only abnormal results are displayed) Labs Reviewed  BASIC METABOLIC PANEL - Abnormal; Notable for the following components:      Result Value   Potassium 3.0 (*)    Glucose, Bld 119 (*)    BUN 24 (*)    Calcium 8.8 (*)    All other components within normal limits  CBC  TROPONIN I (HIGH SENSITIVITY)    EKG EKG Interpretation  Date/Time:  Wednesday November 18 2022 12:28:47 EST Ventricular Rate:  74 PR Interval:  157 QRS Duration: 109 QT Interval:  382 QTC Calculation: 424 R Axis:   68 Text Interpretation: Sinus arrhythmia Ventricular premature complex , new since last tracing Confirmed by 07[birth year]28[birth year]1991 [phone]) on 11/18/2022 12:32:05 PM  Radiology 11/20/2022 Abdomen Limited RUQ (LIVER/GB)  Result Date: 11/18/2022 CLINICAL DATA:  Right upper quadrant abdominal pain. EXAM: ULTRASOUND ABDOMEN LIMITED RIGHT UPPER QUADRANT COMPARISON:  None Available. FINDINGS: Gallbladder: No gallstones or wall thickening visualized. No sonographic Murphy sign noted by  sonographer. Maximal wall thickness is 1.8 mm, within normal limits Common bile duct: Diameter: 2.2 mm, within normal limits Liver: No focal lesion identified. Within normal limits in parenchymal echogenicity. Portal vein is patent on color Doppler imaging with normal direction of blood flow towards the liver. Other: None. IMPRESSION: Normal right upper quadrant ultrasound. Electronically Signed   By: 11/20/2022 M.D.   On: 11/18/2022 15:11   DG Chest Port 1 View  Result Date: 11/18/2022 CLINICAL DATA:  chest pain EXAM:  PORTABLE CHEST - 1 VIEW COMPARISON:  05/23/2013 FINDINGS: Cardiac silhouette is unremarkable. No pneumothorax or pleural effusion. The lungs are clear. The visualized skeletal structures are unremarkable. IMPRESSION: No acute cardiopulmonary process. Electronically Signed   By: Layla Maw M.D.   On: 11/18/2022 13:31    Procedures Procedures    Medications Ordered in ED Medications  ketorolac (TORADOL) 15 MG/ML injection 15 mg (15 mg Intravenous Given 11/18/22 1353)  potassium chloride SA (KLOR-CON M) CR tablet 40 mEq (40 mEq Oral Given 11/18/22 1543)    ED Course/ Medical Decision Making/ A&P Clinical Course as of 11/18/22 1835  Wed Nov 18, 2022  1502 Re-evaluated and noted mild improvement of her symptoms with treatment regimen in the ED.  [SB]  1527 Discussed with patient and mother regarding plans for treatment for possible beginning of shingles. Discussed with patient that we can start treatment with anti-virals and have her closely follow up with her primary care provider. Pt agreeable at this time.  [SB]    Clinical Course User Index [SB] Areal Cochrane A, PA-C                           Medical Decision Making Amount and/or Complexity of Data Reviewed Labs: ordered. Radiology: ordered.  Risk Prescription drug management.   Pt presents with right chest wall pain onset this morning. Still has her gallbladder and appendix. Vital signs, pt afebrile.  On exam, pt with RN chaperone present for breast exam. Pt with several erythematous patches noted to right breast in a linear fashion with TTP at T4 dermatome. No obvious vesicles noted at this time. No TTP noted to ribs on right side. No acute cardiovascular, respiratory, abdominal exam findings. Differential diagnosis includes fracture, dislocation, cholecystitis, strain, shingles.   Additional history obtained:  Additional history obtained from Daughter/Son  Labs:  I ordered, and personally interpreted labs.  The pertinent results include:   Initial trop <2 CBC negative BMP with slightly decreased potassium at 3.0 otherwise unremarkable  Imaging: I ordered imaging studies including Chest x-ray and RUQ [place] I independently visualized and interpreted imaging which showed: no acute findings  I agree with the radiologist interpretation  Medications:  I ordered medication including toradol, potassium for symptom management Reevaluation of the patient after these medicines and interventions, I reevaluated the patient and found that they have improved I have reviewed the patients home medicines and have made adjustments as needed   Disposition: Presentation suspicious for shingles. Doubt fracture, dislcoaiton, ACS, PNA at this time. Troponin negative. After consideration of the diagnostic results and the patients response to treatment, I feel that the patient would benefit from Discharge home. Pt sent with a Rx for acyclovir. Informed to follow up closely with primary care provider. Supportive care measures and strict return precautions discussed with patient at bedside. Pt acknowledges and verbalizes understanding. Pt appears safe for discharge. Follow up as indicated in discharge paperwork.    This chart was dictated using voice recognition software, Dragon. Despite the best efforts of this provider to proofread and correct errors, errors may still occur which can change documentation  meaning.  Final Clinical Impression(s) / ED Diagnoses Final diagnoses:  Herpes zoster without complication    Rx / DC Orders ED Discharge Orders          Ordered    acyclovir (ZOVIRAX) 800 MG tablet  5 times daily,   Status:  Discontinued  11/18/22 1533    acyclovir (ZOVIRAX) 800 MG tablet  5 times daily        11/18/22 1543              Galileah Piggee A, PA-C 11/18/22 Harl Bowie, MD 11/19/22 [phone]

## 2022-11-18 NOTE — ED Notes (Signed)
ED Provider at bedside. 

## 2022-11-18 NOTE — ED Notes (Signed)
Ultrasound in progress at bedside.

## 2024-01-25 ENCOUNTER — Ambulatory Visit: Payer: Medicare Other | Attending: Cardiovascular Disease | Admitting: Cardiovascular Disease

## 2024-01-25 ENCOUNTER — Encounter: Payer: Self-pay | Admitting: Cardiovascular Disease

## 2024-01-25 VITALS — BP 118/78 | HR 62 | Ht 60.0 in | Wt 143.2 lb

## 2024-01-25 DIAGNOSIS — E782 Mixed hyperlipidemia: Secondary | ICD-10-CM | POA: Insufficient documentation

## 2024-01-25 DIAGNOSIS — E785 Hyperlipidemia, unspecified: Secondary | ICD-10-CM | POA: Insufficient documentation

## 2024-01-25 DIAGNOSIS — R931 Abnormal findings on diagnostic imaging of heart and coronary circulation: Secondary | ICD-10-CM | POA: Insufficient documentation

## 2024-01-25 NOTE — Addendum Note (Signed)
Addended by: Bernita Buffy on: 01/25/2024 12:07 PM   Modules accepted: Orders

## 2024-01-25 NOTE — Assessment & Plan Note (Signed)
 Coronary calcium  score obtained by her PCP 11/09/2023 was 16 with calcium  in the left main LAD and circumflex.  She is completely asymptomatic.  I did do a cardiac catheterization on her 09/05/2007 which was entirely normal.  Given his mild elevation in coronary calcium  score her LDL goal should be less than 70.

## 2024-01-25 NOTE — Patient Instructions (Signed)
Medication Instructions:  Your physician recommends that you continue on your current medications as directed. Please refer to the Current Medication list given to you today.  *If you need a refill on your cardiac medications before your next appointment, please call your pharmacy*   Lab Work: Your physician recommends that you return for lab work in: 2 months for FASTING lipid/liver panel  If you have labs (blood work) drawn today and your tests are completely normal, you will receive your results only by: MyChart Message (if you have MyChart) OR A paper copy in the mail If you have any lab test that is abnormal or we need to change your treatment, we will call you to review the results.   Follow-Up: At [facility], you and your health needs are our priority.  As part of our continuing mission to provide you with exceptional heart care, we have created designated Provider Care Teams.  These Care Teams include your primary Cardiologist (physician) and Advanced Practice Providers (APPs -  Physician Assistants and Nurse Practitioners) who all work together to provide you with the care you need, when you need it.  We recommend signing up for the patient portal called "MyChart".  Sign up information is provided on this After Visit Summary.  MyChart is used to connect with patients for Virtual Visits (Telemedicine).  Patients are able to view lab/test results, encounter notes, upcoming appointments, etc.  Non-urgent messages can be sent to your provider as well.   To learn more about what you can do with MyChart, go to [email or web address].    Your next appointment:   12 month(s)  Provider:   Nanetta Batty, MD    Other Instructions

## 2024-01-25 NOTE — Assessment & Plan Note (Signed)
 History of hyperlipidemia on statin therapy and Zetia.  Given her mild elevation in coronary calcium  we want her LDL to be less than 70.  Her last lipid profile performed 10/27/2023 revealed LDL 121.  She was recently started on Zetia.  We will recheck a lipid liver profile in 3 months.

## 2024-01-25 NOTE — Progress Notes (Signed)
 01/25/2024 CAROLEEN STOERMER   [birth year]  [id number]  Primary Physician Sophronia Ozell BROCKS, MD Primary Cardiologist: Dorn JINNY Lesches MD FACP, [place], [place], FSCAI  HPI:  Kathy Kim is a 68 y.o. widowed Caucasian female (husband of 20 years died 7 years ago of a brain aneurysm ), mother of 2 children, grandmother of 10 grandchildren who is retired from being in location manager and aeronautical engineer.  She was referred by her PCP, Dr. Sophronia, for a mildly elevated coronary calcium  score.  Her only risk factor includes treated hyperlipidemia and family history.  Father apparently had bypass surgery.  I did a heart cath on her 09/05/2007 which was entirely normal.  She is entirely asymptomatic.  Coronary calcium  score performed 11/09/2023 was 16 with calcium  in the left main, LAD and circumflex.  She is on statin drug and recently was started on ezetimibe.   Current Meds  Medication Sig   atorvastatin  (LIPITOR) 20 MG tablet Take 20 mg by mouth daily.    buPROPion (WELLBUTRIN XL) 150 MG 24 hr tablet Take 150 mg by mouth daily.   ezetimibe (ZETIA) 10 MG tablet Take 10 mg by mouth daily.   ibuprofen (ADVIL,MOTRIN) 200 MG tablet Take 400 mg by mouth every 4 (four) hours as needed for moderate pain.    indapamide (LOZOL) 1.25 MG tablet Take 1.25 mg by mouth daily.    sertraline (ZOLOFT) 100 MG tablet Take 100 mg by mouth daily.   simvastatin  (ZOCOR ) 40 MG tablet Take 1 tablet (40 mg total) by mouth every evening.   tamsulosin  (FLOMAX ) 0.4 MG CAPS capsule Take 1 capsule (0.4 mg total) by mouth daily after breakfast.   tretinoin (RETIN-A) 0.1 % cream Apply 1 application topically at bedtime.   venlafaxine XR (EFFEXOR-XR) 75 MG 24 hr capsule Take 75 mg by mouth daily.     Allergies  Allergen Reactions   Tetracyclines & Related Hives   Codeine Itching    Social History   Socioeconomic History   Marital status: Widowed    Spouse name: Not on file   Number of children: Not on file   Years of  education: Not on file   Highest education level: Not on file  Occupational History   Not on file  Tobacco Use   Smoking status: Former    Current packs/day: 0.00    Average packs/day: 0.3 packs/day for 15.0 years (3.8 ttl pk-yrs)    Types: Cigarettes    Start date: 05/16/1992    Quit date: 05/17/2007    Years since quitting: 16.7   Smokeless tobacco: Never  Vaping Use   Vaping status: Never Used  Substance and Sexual Activity   Alcohol use: Not Currently    Comment: rare   Drug use: No   Sexual activity: Not on file  Other Topics Concern   Not on file  Social History Narrative   Not on file   Social Drivers of Health   Financial Resource Strain: Low Risk  (07/13/2023)   Received from [facility]   Overall Financial Resource Strain (CARDIA)    Difficulty of Paying Living Expenses: Not hard at all  Food Insecurity: No Food Insecurity (07/13/2023)   Received from [facility]   Hunger Vital Sign    Worried About Running Out of Food in the Last Year: Never true    Ran Out of Food in the Last Year: Never true  Transportation Needs: No Transportation Needs (07/13/2023)   [facility]  PRAPARE - Administrator, Civil Service (Medical): No    Lack of Transportation (Non-Medical): No  Physical Activity: Unknown (07/13/2023)   [facility]   Exercise Vital Sign    Days of Exercise per Week: Patient declined    Minutes of Exercise per Session: 70 min  Stress: No Stress Concern Present (07/13/2023)   Received from [facility] - Occupational Stress Questionnaire    Feeling of Stress : Only a little  Social Connections: Socially Integrated (07/13/2023)   Received from [facility]   Social Network    How would you rate your social network (family, work, friends)?: Good participation with social networks  Intimate Partner Violence: Not At Risk (07/13/2023)   Received from [facility]   HITS     Over the last 12 months how often did your partner physically hurt you?: Never    Over the last 12 months how often did your partner insult you or talk down to you?: Never    Over the last 12 months how often did your partner threaten you with physical harm?: Never    Over the last 12 months how often did your partner scream or curse at you?: Never     Review of Systems: General: negative for chills, fever, night sweats or weight changes.  Cardiovascular: negative for chest pain, dyspnea on exertion, edema, orthopnea, palpitations, paroxysmal nocturnal dyspnea or shortness of breath Dermatological: negative for rash Respiratory: negative for cough or wheezing Urologic: negative for hematuria Abdominal: negative for nausea, vomiting, diarrhea, bright red blood per rectum, melena, or hematemesis Neurologic: negative for visual changes, syncope, or dizziness All other systems reviewed and are otherwise negative except as noted above.    Blood pressure 118/78, pulse 62, height 5' (1.524 m), weight 143 lb 3.2 oz (65 kg), SpO2 98%.  General appearance: alert and no distress Neck: no adenopathy, no carotid bruit, no JVD, supple, symmetrical, trachea midline, and thyroid not enlarged, symmetric, no tenderness/mass/nodules Lungs: clear to auscultation bilaterally Heart: regular rate and rhythm, S1, S2 normal, no murmur, click, rub or gallop Extremities: extremities normal, atraumatic, no cyanosis or edema Pulses: 2+ and symmetric Skin: Skin color, texture, turgor normal. No rashes or lesions Neurologic: Grossly normal  EKG EKG Interpretation Date/Time:  Tuesday January 25 2024 11:21:18 EST Ventricular Rate:  62 PR Interval:  156 QRS Duration:  76 QT Interval:  412 QTC Calculation: 418 R Axis:   69  Text Interpretation: Normal sinus rhythm with sinus arrhythmia Cannot rule out Anterior infarct , age undetermined When compared with ECG of 18-Nov-2022 12:28, PREVIOUS ECG IS PRESENT  Confirmed by Court Carrier [phone]) on 01/25/2024 11:37:21 AM    ASSESSMENT AND PLAN:   Hyperlipidemia History of hyperlipidemia on statin therapy and Zetia.  Given her mild elevation in coronary calcium  we want her LDL to be less than 70.  Her last lipid profile performed 10/27/2023 revealed LDL 121.  She was recently started on Zetia.  We will recheck a lipid liver profile in 3 months.  Elevated coronary artery calcium  score Coronary calcium  score obtained by her PCP 11/09/2023 was 16 with calcium  in the left main LAD and circumflex.  She is completely asymptomatic.  I did do a cardiac catheterization on her 09/05/2007 which was entirely normal.  Given his mild elevation in coronary calcium  score her LDL goal should be less than 70.     Carrier DOROTHA Court MD FACP,FACC,FAHA, [facility] 01/25/2024  11:54 AM

## 2024-02-01 ENCOUNTER — Other Ambulatory Visit: Payer: Self-pay | Admitting: Obstetrics and Gynecology

## 2024-02-01 DIAGNOSIS — N6310 Unspecified lump in the right breast, unspecified quadrant: Secondary | ICD-10-CM

## 2024-02-11 ENCOUNTER — Other Ambulatory Visit: Payer: Medicare Other

## 2024-02-16 LAB — HEPATIC FUNCTION PANEL
ALT: 18 IU/L (ref 0–32)
AST: 18 IU/L (ref 0–40)
Albumin: 4.4 g/dL (ref 3.9–4.9)
Alkaline Phosphatase: 98 IU/L (ref 44–121)
Bilirubin Total: 0.5 mg/dL (ref 0.0–1.2)
Bilirubin, Direct: 0.15 mg/dL (ref 0.00–0.40)
Total Protein: 7 g/dL (ref 6.0–8.5)

## 2024-02-16 LAB — LIPID PANEL
Chol/HDL Ratio: 3.5 ratio (ref 0.0–4.4)
Cholesterol, Total: 204 mg/dL — ABNORMAL HIGH (ref 100–199)
HDL: 59 mg/dL (ref >39)
LDL Chol Calc (NIH): 121 mg/dL — ABNORMAL HIGH (ref 0–99)
Triglycerides: 139 mg/dL (ref 0–149)
VLDL Cholesterol Cal: 24 mg/dL (ref 5–40)

## 2024-02-21 ENCOUNTER — Ambulatory Visit
Admission: RE | Admit: 2024-02-21 | Discharge: 2024-02-21 | Disposition: A | Discharge status: Home / Self Care | Payer: Medicare Other | Source: Ambulatory Visit | Attending: Obstetrics and Gynecology | Admitting: Obstetrics and Gynecology

## 2024-02-21 DIAGNOSIS — N6310 Unspecified lump in the right breast, unspecified quadrant: Secondary | ICD-10-CM

## 2024-02-22 ENCOUNTER — Other Ambulatory Visit: Payer: Self-pay | Admitting: *Deleted

## 2024-02-22 DIAGNOSIS — E782 Mixed hyperlipidemia: Secondary | ICD-10-CM

## 2024-02-22 MED ORDER — ATORVASTATIN CALCIUM 80 MG PO TABS: 80.0000 mg | ORAL_TABLET | Freq: Every day | ORAL | 3 refills | Status: AC

## 2024-03-17 ENCOUNTER — Telehealth: Payer: Self-pay | Admitting: Cardiovascular Disease

## 2024-03-17 NOTE — Telephone Encounter (Signed)
 Pt c/o medication issue:  1. Name of Medication: atorvastatin (LIPITOR) 80 MG tablet   2. How are you currently taking this medication (dosage and times per day)? Not sure  3. Are you having a reaction (difficulty breathing--STAT)? no  4. What is your medication issue? Pt wants to know if Dr Allyson Sabal increased this medication to 80 mg. Please advise

## 2024-03-17 NOTE — Telephone Encounter (Signed)
 Left message for patient to call back
